# Patient Record
Sex: Female | Born: 1960 | Hispanic: No | Marital: Married | Smoking: Never smoker
Health system: Southern US, Community
[De-identification: ages and names within clinical notes are randomized; demographics above are authoritative.]

## PROBLEM LIST (undated history)

## (undated) DIAGNOSIS — E7800 Pure hypercholesterolemia, unspecified: Secondary | ICD-10-CM

## (undated) DIAGNOSIS — E119 Type 2 diabetes mellitus without complications: Secondary | ICD-10-CM

## (undated) HISTORY — DX: Pure hypercholesterolemia, unspecified: E78.00

## (undated) HISTORY — PX: AUGMENTATION MAMMAPLASTY: SUR837

## (undated) HISTORY — DX: Type 2 diabetes mellitus without complications: E11.9

---

## 2009-04-25 ENCOUNTER — Other Ambulatory Visit: Admission: RE | Admit: 2009-04-25 | Discharge: 2009-04-25 | Payer: Self-pay | Admitting: Family Medicine

## 2009-05-11 ENCOUNTER — Encounter: Admission: RE | Admit: 2009-05-11 | Discharge: 2009-05-11 | Payer: Self-pay | Admitting: Family Medicine

## 2009-09-20 ENCOUNTER — Ambulatory Visit (HOSPITAL_COMMUNITY): Admission: RE | Admit: 2009-09-20 | Discharge: 2009-09-20 | Payer: Self-pay | Admitting: Specialist

## 2009-10-31 ENCOUNTER — Encounter: Admission: RE | Admit: 2009-10-31 | Discharge: 2009-10-31 | Payer: Self-pay | Admitting: Family Medicine

## 2010-08-03 ENCOUNTER — Other Ambulatory Visit: Payer: Self-pay | Admitting: Family Medicine

## 2010-08-03 DIAGNOSIS — Z1231 Encounter for screening mammogram for malignant neoplasm of breast: Secondary | ICD-10-CM

## 2010-08-14 ENCOUNTER — Ambulatory Visit
Admission: RE | Admit: 2010-08-14 | Discharge: 2010-08-14 | Disposition: A | Discharge status: Home / Self Care | Payer: Managed Care, Other (non HMO) | Source: Ambulatory Visit | Attending: Family Medicine | Admitting: Family Medicine

## 2010-08-14 DIAGNOSIS — Z1231 Encounter for screening mammogram for malignant neoplasm of breast: Secondary | ICD-10-CM

## 2010-08-15 ENCOUNTER — Other Ambulatory Visit: Payer: Self-pay | Admitting: Family Medicine

## 2010-08-15 DIAGNOSIS — R928 Other abnormal and inconclusive findings on diagnostic imaging of breast: Secondary | ICD-10-CM

## 2010-08-27 ENCOUNTER — Ambulatory Visit
Admission: RE | Admit: 2010-08-27 | Discharge: 2010-08-27 | Disposition: A | Discharge status: Home / Self Care | Payer: Managed Care, Other (non HMO) | Source: Ambulatory Visit | Attending: Family Medicine | Admitting: Family Medicine

## 2010-08-27 DIAGNOSIS — R928 Other abnormal and inconclusive findings on diagnostic imaging of breast: Secondary | ICD-10-CM

## 2011-05-24 ENCOUNTER — Other Ambulatory Visit: Payer: Self-pay | Admitting: Family Medicine

## 2011-05-24 ENCOUNTER — Ambulatory Visit
Admission: RE | Admit: 2011-05-24 | Discharge: 2011-05-24 | Disposition: A | Discharge status: Home / Self Care | Payer: Managed Care, Other (non HMO) | Source: Ambulatory Visit | Attending: Family Medicine | Admitting: Family Medicine

## 2011-05-24 DIAGNOSIS — M5412 Radiculopathy, cervical region: Secondary | ICD-10-CM

## 2011-06-18 ENCOUNTER — Other Ambulatory Visit: Payer: Self-pay | Admitting: Family Medicine

## 2011-06-18 ENCOUNTER — Other Ambulatory Visit (HOSPITAL_COMMUNITY)
Admission: RE | Admit: 2011-06-18 | Discharge: 2011-06-18 | Disposition: A | Discharge status: Home / Self Care | Payer: Managed Care, Other (non HMO) | Source: Ambulatory Visit | Attending: Family Medicine | Admitting: Family Medicine

## 2011-06-18 DIAGNOSIS — Z124 Encounter for screening for malignant neoplasm of cervix: Secondary | ICD-10-CM | POA: Insufficient documentation

## 2011-06-18 DIAGNOSIS — Z1159 Encounter for screening for other viral diseases: Secondary | ICD-10-CM | POA: Insufficient documentation

## 2011-07-01 ENCOUNTER — Other Ambulatory Visit: Payer: Self-pay | Admitting: Family Medicine

## 2011-07-01 DIAGNOSIS — Z1231 Encounter for screening mammogram for malignant neoplasm of breast: Secondary | ICD-10-CM

## 2011-08-15 ENCOUNTER — Ambulatory Visit: Payer: Managed Care, Other (non HMO)

## 2011-09-11 ENCOUNTER — Other Ambulatory Visit: Payer: Self-pay | Admitting: Family Medicine

## 2011-09-11 DIAGNOSIS — R102 Pelvic and perineal pain: Secondary | ICD-10-CM

## 2011-09-19 ENCOUNTER — Ambulatory Visit
Admission: RE | Admit: 2011-09-19 | Discharge: 2011-09-19 | Disposition: A | Discharge status: Home / Self Care | Payer: Managed Care, Other (non HMO) | Source: Ambulatory Visit | Attending: Family Medicine | Admitting: Family Medicine

## 2011-09-19 DIAGNOSIS — R102 Pelvic and perineal pain: Secondary | ICD-10-CM

## 2012-06-23 ENCOUNTER — Other Ambulatory Visit: Payer: Self-pay | Admitting: Family Medicine

## 2012-06-23 ENCOUNTER — Other Ambulatory Visit (HOSPITAL_COMMUNITY)
Admission: RE | Admit: 2012-06-23 | Discharge: 2012-06-23 | Disposition: A | Discharge status: Home / Self Care | Payer: Managed Care, Other (non HMO) | Source: Ambulatory Visit | Attending: Family Medicine | Admitting: Family Medicine

## 2012-06-23 DIAGNOSIS — Z124 Encounter for screening for malignant neoplasm of cervix: Secondary | ICD-10-CM | POA: Insufficient documentation

## 2013-05-18 ENCOUNTER — Ambulatory Visit
Admission: RE | Admit: 2013-05-18 | Discharge: 2013-05-18 | Disposition: A | Discharge status: Home / Self Care | Payer: Managed Care, Other (non HMO) | Source: Ambulatory Visit | Attending: Family Medicine | Admitting: Family Medicine

## 2013-05-18 ENCOUNTER — Other Ambulatory Visit: Payer: Self-pay | Admitting: Family Medicine

## 2013-05-18 DIAGNOSIS — M25529 Pain in unspecified elbow: Secondary | ICD-10-CM

## 2013-06-07 ENCOUNTER — Other Ambulatory Visit: Payer: Self-pay | Admitting: Family Medicine

## 2013-06-07 ENCOUNTER — Other Ambulatory Visit: Payer: Self-pay

## 2013-06-07 ENCOUNTER — Other Ambulatory Visit (HOSPITAL_COMMUNITY)
Admission: RE | Admit: 2013-06-07 | Discharge: 2013-06-07 | Disposition: A | Discharge status: Home / Self Care | Payer: Managed Care, Other (non HMO) | Source: Ambulatory Visit | Attending: Family Medicine | Admitting: Family Medicine

## 2013-06-07 DIAGNOSIS — Z124 Encounter for screening for malignant neoplasm of cervix: Secondary | ICD-10-CM | POA: Insufficient documentation

## 2013-06-07 DIAGNOSIS — Z9882 Breast implant status: Secondary | ICD-10-CM

## 2013-06-07 DIAGNOSIS — Z1231 Encounter for screening mammogram for malignant neoplasm of breast: Secondary | ICD-10-CM

## 2013-06-14 ENCOUNTER — Ambulatory Visit: Payer: Managed Care, Other (non HMO)

## 2013-06-24 ENCOUNTER — Encounter (INDEPENDENT_AMBULATORY_CARE_PROVIDER_SITE_OTHER): Payer: Self-pay

## 2013-06-24 ENCOUNTER — Ambulatory Visit
Admission: RE | Admit: 2013-06-24 | Discharge: 2013-06-24 | Disposition: A | Discharge status: Home / Self Care | Payer: Private Health Insurance - Indemnity | Source: Ambulatory Visit

## 2013-06-24 DIAGNOSIS — Z1231 Encounter for screening mammogram for malignant neoplasm of breast: Secondary | ICD-10-CM

## 2013-06-24 DIAGNOSIS — Z9882 Breast implant status: Secondary | ICD-10-CM

## 2013-06-28 ENCOUNTER — Other Ambulatory Visit: Payer: Self-pay | Admitting: Family Medicine

## 2013-06-28 DIAGNOSIS — R928 Other abnormal and inconclusive findings on diagnostic imaging of breast: Secondary | ICD-10-CM

## 2013-07-02 ENCOUNTER — Ambulatory Visit
Admission: RE | Admit: 2013-07-02 | Discharge: 2013-07-02 | Disposition: A | Discharge status: Home / Self Care | Payer: Private Health Insurance - Indemnity | Source: Ambulatory Visit | Attending: Family Medicine | Admitting: Family Medicine

## 2013-07-02 ENCOUNTER — Other Ambulatory Visit: Payer: Self-pay | Admitting: Family Medicine

## 2013-07-02 DIAGNOSIS — R928 Other abnormal and inconclusive findings on diagnostic imaging of breast: Secondary | ICD-10-CM

## 2013-07-21 ENCOUNTER — Other Ambulatory Visit: Payer: Self-pay | Admitting: Family Medicine

## 2013-07-21 DIAGNOSIS — T8549XA Other mechanical complication of breast prosthesis and implant, initial encounter: Secondary | ICD-10-CM

## 2013-08-07 ENCOUNTER — Other Ambulatory Visit: Payer: Private Health Insurance - Indemnity

## 2013-08-17 ENCOUNTER — Ambulatory Visit
Admission: RE | Admit: 2013-08-17 | Discharge: 2013-08-17 | Disposition: A | Discharge status: Home / Self Care | Payer: Private Health Insurance - Indemnity | Source: Ambulatory Visit | Attending: Family Medicine | Admitting: Family Medicine

## 2013-08-17 DIAGNOSIS — T8549XA Other mechanical complication of breast prosthesis and implant, initial encounter: Secondary | ICD-10-CM

## 2014-07-11 ENCOUNTER — Other Ambulatory Visit: Payer: Self-pay | Admitting: Family Medicine

## 2014-07-11 ENCOUNTER — Other Ambulatory Visit (HOSPITAL_COMMUNITY)
Admission: RE | Admit: 2014-07-11 | Discharge: 2014-07-11 | Disposition: A | Discharge status: Home / Self Care | Payer: Managed Care, Other (non HMO) | Source: Ambulatory Visit | Attending: Family Medicine | Admitting: Family Medicine

## 2014-07-11 DIAGNOSIS — Z01411 Encounter for gynecological examination (general) (routine) with abnormal findings: Secondary | ICD-10-CM | POA: Diagnosis present

## 2014-07-13 LAB — CYTOLOGY - PAP

## 2015-07-14 ENCOUNTER — Other Ambulatory Visit: Payer: Self-pay

## 2015-07-14 DIAGNOSIS — Z1231 Encounter for screening mammogram for malignant neoplasm of breast: Secondary | ICD-10-CM

## 2015-08-07 ENCOUNTER — Other Ambulatory Visit: Payer: Self-pay

## 2015-08-07 ENCOUNTER — Other Ambulatory Visit: Payer: Self-pay | Admitting: Family Medicine

## 2015-08-07 DIAGNOSIS — N63 Unspecified lump in unspecified breast: Secondary | ICD-10-CM

## 2015-08-09 ENCOUNTER — Ambulatory Visit: Payer: Private Health Insurance - Indemnity

## 2015-08-16 ENCOUNTER — Ambulatory Visit
Admission: RE | Admit: 2015-08-16 | Discharge: 2015-08-16 | Disposition: A | Discharge status: Home / Self Care | Payer: Managed Care, Other (non HMO) | Source: Ambulatory Visit | Attending: Family Medicine | Admitting: Family Medicine

## 2015-08-16 ENCOUNTER — Other Ambulatory Visit: Payer: Self-pay | Admitting: Family Medicine

## 2015-08-16 DIAGNOSIS — N63 Unspecified lump in unspecified breast: Secondary | ICD-10-CM

## 2016-07-26 ENCOUNTER — Other Ambulatory Visit: Payer: Self-pay | Admitting: Family Medicine

## 2016-07-26 DIAGNOSIS — Z1231 Encounter for screening mammogram for malignant neoplasm of breast: Secondary | ICD-10-CM

## 2016-08-19 ENCOUNTER — Ambulatory Visit
Admission: RE | Admit: 2016-08-19 | Discharge: 2016-08-19 | Disposition: A | Discharge status: Home / Self Care | Payer: Managed Care, Other (non HMO) | Source: Ambulatory Visit | Attending: Family Medicine | Admitting: Family Medicine

## 2016-08-19 DIAGNOSIS — Z1231 Encounter for screening mammogram for malignant neoplasm of breast: Secondary | ICD-10-CM

## 2016-08-20 ENCOUNTER — Other Ambulatory Visit: Payer: Self-pay | Admitting: Family Medicine

## 2016-08-20 DIAGNOSIS — R928 Other abnormal and inconclusive findings on diagnostic imaging of breast: Secondary | ICD-10-CM

## 2016-08-23 ENCOUNTER — Ambulatory Visit
Admission: RE | Admit: 2016-08-23 | Discharge: 2016-08-23 | Disposition: A | Discharge status: Home / Self Care | Payer: Managed Care, Other (non HMO) | Source: Ambulatory Visit | Attending: Family Medicine | Admitting: Family Medicine

## 2016-08-23 DIAGNOSIS — R928 Other abnormal and inconclusive findings on diagnostic imaging of breast: Secondary | ICD-10-CM

## 2016-10-03 ENCOUNTER — Encounter: Payer: Self-pay | Admitting: Physical Therapy

## 2016-10-03 ENCOUNTER — Ambulatory Visit: Payer: Managed Care, Other (non HMO) | Attending: Family Medicine | Admitting: Physical Therapy

## 2016-10-03 DIAGNOSIS — M542 Cervicalgia: Secondary | ICD-10-CM

## 2016-10-03 DIAGNOSIS — M6281 Muscle weakness (generalized): Secondary | ICD-10-CM | POA: Diagnosis present

## 2016-10-03 DIAGNOSIS — G8929 Other chronic pain: Secondary | ICD-10-CM | POA: Insufficient documentation

## 2016-10-03 DIAGNOSIS — M545 Low back pain: Secondary | ICD-10-CM | POA: Insufficient documentation

## 2016-10-03 NOTE — Therapy (Signed)
[facility] [address] [place], [place], [address] Phone: [phone]   Fax:  [phone]  Physical Therapy Evaluation  Patient Details  Name: Elizabeth Norris MRN: [id number] Date of Birth: [birth year] Referring Provider: Dr. Farris Has  Encounter Date: 10/03/2016      PT End of Session - 10/03/16 1520    Visit Number 1   Date for PT Re-Evaluation 11/28/16   Authorization Type Aetna   Authorization - Visit Number 1   Authorization - Number of Visits 25   PT Start Time 1430   PT Stop Time 1515   PT Time Calculation (min) 45 min   Activity Tolerance Patient tolerated treatment well   Behavior During Therapy [facility] for tasks assessed/performed      Past Medical History:  Diagnosis Date  . Hypercholesterolemia     Past Surgical History:  Procedure Laterality Date  . AUGMENTATION MAMMAPLASTY Bilateral     There were no vitals filed for this visit.       Subjective Assessment - 10/03/16 1437    Subjective Patient reports chronic issues with her neck and back.  The last 2-3 weeks her neck has flared up and in shoulders. Patient was pressure washing overhead and had increased pain afterwards   Patient Stated Goals reduce pain   Currently in Pain? Yes   Pain Score 5    Pain Location Neck   Pain Orientation Right;Left   Pain Descriptors / Indicators Sore;Constant   Pain Type Chronic pain   Pain Onset 1 to 4 weeks ago   Pain Frequency Constant   Aggravating Factors  movement   Pain Relieving Factors pressure on neck   Multiple Pain Sites Yes   Pain Score 8   Pain Location Back   Pain Orientation Lower   Pain Descriptors / Indicators Tightness   Pain Type Chronic pain   Pain Onset 1 to 4 weeks ago   Aggravating Factors  movement; laying down   Pain Relieving Factors stretch, massage, heat, hot shower            [facility] PT Assessment - 10/03/16 0001      Assessment   Medical Diagnosis M54.2  Neck pain   Referring Provider Dr. Farris Has   Onset Date/Surgical Date 09/12/16   Prior Therapy None     Precautions   Precautions None     Restrictions   Weight Bearing Restrictions No     Balance Screen   Has the patient fallen in the past 6 months No   Has the patient had a decrease in activity level because of a fear of falling?  No   Is the patient reluctant to leave their home because of a fear of falling?  No     Home Tourist information centre manager residence     Prior Function   Level of Independence Independent     Cognition   Overall Cognitive Status Within Functional Limits for tasks assessed     Observation/Other Assessments   Focus on Therapeutic Outcomes (FOTO)  37% limitation  goal is 31% limitation     Posture/Postural Control   Posture/Postural Control Postural limitations   Postural Limitations Rounded Shoulders;Forward head     ROM / Strength   AROM / PROM / Strength AROM;PROM;Strength     AROM   Cervical Flexion full   Cervical Extension full wiht chin going to left   Cervical - Right Side Bend full  Cervical - Left Side Bend full   Cervical - Right Rotation decreased by 25%   Cervical - Left Rotation decreased by 25%   Lumbar Flexion full   Lumbar Extension decreased by 25%   Lumbar - Right Side Bend decreased by 25%   Lumbar - Left Side Bend full   Lumbar - Right Rotation decreased by 25%    Lumbar - Left Rotation full     Strength   Overall Strength Comments bil. shoulder strength is 4/5   Strength Assessment Site Hip   Right Hip Flexion 4/5   Right Hip ABduction 3+/5   Left Hip Flexion 4/5   Left Hip ABduction 3+/5     Palpation   SI assessment  decreased mobility of L1-L5   Palpation comment tenderness located in lumbar paraspinals, bil. cervical paraspinals            Objective measurements completed on examination: See above findings.                  PT Education - 10/03/16 1519     Education provided Yes   Education Details information on dry needling; stretches for back and nexk   Person(s) Educated Patient   Methods Explanation;Demonstration;Verbal cues;Handout   Comprehension Returned demonstration;Verbalized understanding          PT Short Term Goals - 10/03/16 1529      PT SHORT TERM GOAL #1   Title independent with initial HEP   Time 4   Period Weeks   Status New   Target Date 10/31/16     PT SHORT TERM GOAL #2   Title understand correct body mechanics with daily tasks and cleaning to reduce strain on neck and back   Time 4   Period Weeks   Status New   Target Date 10/31/16     PT SHORT TERM GOAL #3   Title pain decreased >/= 25% with daily tasks due to reduction in trigger points in cervical and back muscles   Time 4   Period Weeks   Status New   Target Date 10/31/16           PT Long Term Goals - 10/03/16 1507      PT LONG TERM GOAL #1   Title independent with HEP   Time 8   Period Weeks   Status New   Target Date 11/28/16     PT LONG TERM GOAL #2   Title perfrom daily activities with stiffness in neck and back decreased >/= 75% due to improved muscle flexibility   Time 8   Status New   Target Date 11/28/16     PT LONG TERM GOAL #3   Title sitting to watch a 1 hour TV program with cervical and neck and neck pain decreased >/= 75% due to increased postural strength   Time 8   Period Weeks   Status New   Target Date 11/28/16     PT LONG TERM GOAL #4   Title cleaning her home when bending down with pain decreased >/= 75% due to increased postural  and bilateral hip strength   Time 8   Period Weeks   Status New   Target Date 11/28/16     PT LONG TERM GOAL #5   Title FOTO score </= 31% limitation   Time 8   Period Weeks   Status New   Target Date 11/28/16  Plan - 10/03/16 1521    Clinical Impression Statement Patient is a 56 year old female with chronic neck and back which flared-up 3 weeks  ago when she was power washing overhead. Cervical pain is 5/10 and back pain is 8/10 and is constant. Patient reports activity and movement increase pain.  Bilateral shouder strength is 4/5 and bilateral hip abduction 3+/5 and fleixon is 4/5.  Patient has limitation with lumbar extension and right sidebending.  Patient has decreased bilateral cervical rotation.  Patient has decreased mobility of L1-L5.  Palpable tenderness located in bil. cervical, thorasic, and lumbar paraspinals.  Patient will benefit from skilled therapy to reduce muscle tension and improve strength to reduce pain with daily activities.    History and Personal Factors relevant to plan of care: none   Clinical Presentation Stable   Clinical Presentation due to: stable condition   Clinical Decision Making Low   Rehab Potential Excellent   Clinical Impairments Affecting Rehab Potential None   PT Frequency 2x / week   PT Duration 8 weeks   PT Treatment/Interventions Cryotherapy;Electrical Stimulation;Moist Heat;Ultrasound;Traction;Patient/family education;Neuromuscular re-education;Therapeutic exercise;Therapeutic activities;Manual techniques;Dry needling;Taping   PT Next Visit Plan dry needling to cervical multifidi and upper trap, lumbar multifidi; soft tissue work to same; Estate manager/land agentbody mechanics with daily tasks; bil. shoulder, hip abductors, postural muscle strength   PT Home Exercise Plan progress as needed   Consulted and Agree with Plan of Care Patient      Patient will benefit from skilled therapeutic intervention in order to improve the following deficits and impairments:  Decreased range of motion, Increased fascial restricitons, Pain, Decreased strength, Decreased mobility, Decreased activity tolerance, Decreased endurance  Visit Diagnosis: Muscle weakness (generalized) - Plan: PT plan of care cert/re-cert  Cervicalgia - Plan: PT plan of care cert/re-cert  Chronic midline low back pain without sciatica - Plan: PT plan of  care cert/re-cert     Problem List There are no active problems to display for this patient.   Eulis FosterCheryl Emilija Bohman, PT 10/03/16 3:34 PM    [facility] [address] [place], [place], [address] Phone: [phone]   Fax:  [phone]  Name: Derl BarrowYolanda Van Parys MRN: [id number] Date of Birth: 1960-07-19

## 2016-10-03 NOTE — Patient Instructions (Addendum)
Arm / Leg Lift: Opposite (Prone)    Lift right leg and opposite arm _2___ inches from floor, keeping knee locked. Repeat _10___ times per set. Do __1__ sets per session. Do __1__ sessions per day.  http://orth.exer.us/100   Copyright  VHI. All rights reserved.  Trigger Point Dry Needling  . What is Trigger Point Dry Needling (DN)? o DN is a physical therapy technique used to treat muscle pain and dysfunction. Specifically, DN helps deactivate muscle trigger points (muscle knots).  o A thin filiform needle is used to penetrate the skin and stimulate the underlying trigger point. The goal is for a local twitch response (LTR) to occur and for the trigger point to relax. No medication of any kind is injected during the procedure.   . What Does Trigger Point Dry Needling Feel Like?  o The procedure feels different for each individual patient. Some patients report that they do not actually feel the needle enter the skin and overall the process is not painful. Very mild bleeding may occur. However, many patients feel a deep cramping in the muscle in which the needle was inserted. This is the local twitch response.   Marland Kitchen How Will I feel after the treatment? o Soreness is normal, and the onset of soreness may not occur for a few hours. Typically this soreness does not last longer than two days.  o Bruising is uncommon, however; ice can be used to decrease any possible bruising.  o In rare cases feeling tired or nauseous after the treatment is normal. In addition, your symptoms may get worse before they get better, this period will typically not last longer than 24 hours.   . What Can I do After My Treatment? o Increase your hydration by drinking more water for the next 24 hours. o You may place ice or heat on the areas treated that have become sore, however, do not use heat on inflamed or bruised areas. Heat often brings more relief post needling. o You can continue your regular activities, but  vigorous activity is not recommended initially after the treatment for 24 hours. o DN is best combined with other physical therapy such as strengthening, stretching, and other therapies.    Knee-to-Chest Stretch: Bilateral    With hands behind knees, pull both knees in to chest until a comfortable stretch is felt in lower back and buttocks. Keep back relaxed. Hold __30__ seconds. Repeat _2___ times per set. Do _1___ sets per session. Do _1___ sessions per day.  http://orth.exer.us/128   Copyright  VHI. All rights reserved.  Lumbar Rotation Stretch    Lie on back with left knee drawn toward chest. Slowly bring bent leg across body until stretch is felt in lower back/hip area. Hold _30___ seconds. Repeat __2__ times per set. Do _1___ sets per session. Do _1___ sessions per day.  http://orth.exer.us/198   Copyright  VHI. All rights reserved.  Flexibility: Neck Stretch    Grasp left arm above wrist and pull down across body while gently tilting head same direction. Hold __30__ seconds. Relax. Repeat __1__ times per set. Do _1___ sets per session. Do ___1_ sessions per day.  http://orth.exer.us/346   Copyright  VHI. All rights reserved.  Chest / Bicep Stretch    Lace fingers behind back and squeeze shoulder blades together. Slowly raise and straighten arms. Hold __30__ seconds. Repeat __2__ times per set. Do __1__ sets per session. Do __1__ sessions per day.  http://orth.exer.us/352   Copyright  VHI. All rights reserved.  Levator  Scapula Stretch, Sitting    Sit, one hand tucked under hip on side to be stretched, other hand over top of head. Turn head toward other side and look down. Use hand on head to gently stretch neck in that position. Hold _30__ seconds. Repeat _2__ times per session. Do _1__ sessions per day.  Copyright  VHI. All rights reserved.  Angry Cat, All Fours    Kneel on hands and knees. Tuck chin and tighten stomach. Exhale and round back  upward. Inhale and arch back downward. Hold each position _1__ seconds. Repeat _10__ times per session. Do _1__ sessions per day.  Copyright  VHI. All rights reserved.  [facility] [address], [address] [place], [place] [address] Phone # [phone] Fax [phone]

## 2016-10-14 ENCOUNTER — Ambulatory Visit: Payer: Managed Care, Other (non HMO) | Admitting: Physical Therapy

## 2016-10-14 ENCOUNTER — Encounter: Payer: Self-pay | Admitting: Physical Therapy

## 2016-10-14 DIAGNOSIS — M545 Low back pain: Secondary | ICD-10-CM

## 2016-10-14 DIAGNOSIS — M6281 Muscle weakness (generalized): Secondary | ICD-10-CM

## 2016-10-14 DIAGNOSIS — G8929 Other chronic pain: Secondary | ICD-10-CM

## 2016-10-14 DIAGNOSIS — M542 Cervicalgia: Secondary | ICD-10-CM

## 2016-10-14 NOTE — Patient Instructions (Signed)
   Upper Cervical Rotation Self Mobilization   With your arms crossed hold the towel firmly to your chest and the other hand has the towel pressed against your check bone. Pull the towel across your cheekbone with the towel doing the work and your neck feeling the stretch  Hold 20 sec repeat 3x each side

## 2016-10-14 NOTE — Therapy (Signed)
[facility] [address], [address] [place], [place], [address] Phone: [phone]   Fax:  [phone]  Physical Therapy Treatment  Patient Details  Name: Elizabeth Norris MRN: [id number] Date of Birth: Aug 14, 1960 Referring Provider: Dr. Farris Has  Encounter Date: 10/14/2016      PT End of Session - 10/14/16 1542    Visit Number 2   Date for PT Re-Evaluation 11/28/16   Authorization Type Aetna   Authorization - Visit Number 2   Authorization - Number of Visits 25   PT Start Time 1538   PT Stop Time 1618   PT Time Calculation (min) 40 min   Activity Tolerance Patient tolerated treatment well   Behavior During Therapy [facility] for tasks assessed/performed      Past Medical History:  Diagnosis Date  . Hypercholesterolemia     Past Surgical History:  Procedure Laterality Date  . AUGMENTATION MAMMAPLASTY Bilateral     There were no vitals filed for this visit.                       [facility] Adult PT Treatment/Exercise - 10/14/16 0001      Exercises   Exercises Neck     Neck Exercises: Seated   Other Seated Exercise cervical rotation with towel, upper trap stretch - 3x 30 sec     Manual Therapy   Manual Therapy Soft tissue mobilization   Soft tissue mobilization suboccipitals, cervical paraspinals, UT, thoracic paraspinals          Trigger Point Dry Needling - 10/14/16 1617    Consent Given? Yes   Education Handout Provided Yes   Muscles Treated Upper Body Upper trapezius;Suboccipitals muscle group;Oblique capitus   Upper Trapezius Response Twitch reponse elicited;Palpable increased muscle length   Oblique Capitus Response Twitch response elicited;Palpable increased muscle length  cervical and T1-3 multifidi   SubOccipitals Response Twitch response elicited;Palpable increased muscle length              PT Education - 10/14/16 1628    Education provided Yes   Education Details  cervical rotation    Person(s) Educated Patient   Methods Explanation;Demonstration;Verbal cues;Handout   Comprehension Verbalized understanding          PT Short Term Goals - 10/03/16 1529      PT SHORT TERM GOAL #1   Title independent with initial HEP   Time 4   Period Weeks   Status New   Target Date 10/31/16     PT SHORT TERM GOAL #2   Title understand correct body mechanics with daily tasks and cleaning to reduce strain on neck and back   Time 4   Period Weeks   Status New   Target Date 10/31/16     PT SHORT TERM GOAL #3   Title pain decreased >/= 25% with daily tasks due to reduction in trigger points in cervical and back muscles   Time 4   Period Weeks   Status New   Target Date 10/31/16           PT Long Term Goals - 10/03/16 1507      PT LONG TERM GOAL #1   Title independent with HEP   Time 8   Period Weeks   Status New   Target Date 11/28/16     PT LONG TERM GOAL #2   Title perfrom daily activities with stiffness in neck and back decreased >/= 75% due to  improved muscle flexibility   Time 8   Status New   Target Date 11/28/16     PT LONG TERM GOAL #3   Title sitting to watch a 1 hour TV program with cervical and neck and neck pain decreased >/= 75% due to increased postural strength   Time 8   Period Weeks   Status New   Target Date 11/28/16     PT LONG TERM GOAL #4   Title cleaning her home when bending down with pain decreased >/= 75% due to increased postural  and bilateral hip strength   Time 8   Period Weeks   Status New   Target Date 11/28/16     PT LONG TERM GOAL #5   Title FOTO score </= 31% limitation   Time 8   Period Weeks   Status New   Target Date 11/28/16               Plan - 10/14/16 1645    Clinical Impression Statement Patient had improved muscle length after needling and mnaual technques.  Pt did not meet any goals as of today due to only being initial treatment since eval.  She will benefit from skilled PT  to work on improved flexibility and ROM as well as improved posture in order to improve ability to perform functional activities.   PT Treatment/Interventions Cryotherapy;Electrical Stimulation;Moist Heat;Ultrasound;Traction;Patient/family education;Neuromuscular re-education;Therapeutic exercise;Therapeutic activities;Manual techniques;Dry needling;Taping   PT Next Visit Plan f/u on dry needling to cervical multifidi and upper trap, soft tissue/dry needling add lumbar multifidi; body mechanics with daily tasks; bil. shoulder, hip abductors, postural muscle strength   PT Home Exercise Plan progress as needed   Consulted and Agree with Plan of Care Patient      Patient will benefit from skilled therapeutic intervention in order to improve the following deficits and impairments:  Decreased range of motion, Increased fascial restricitons, Pain, Decreased strength, Decreased mobility, Decreased activity tolerance, Decreased endurance  Visit Diagnosis: Muscle weakness (generalized)  Cervicalgia  Chronic midline low back pain without sciatica     Problem List There are no active problems to display for this patient.   [name], PT 10/14/2016, 4:53 PM  [facility] [address], [address] [place], [place], [address] Phone: [phone]   Fax:  [phone]  Name: Elizabeth Norris MRN: [id number] Date of Birth: Mar 17, 1960

## 2016-10-16 ENCOUNTER — Ambulatory Visit: Payer: Managed Care, Other (non HMO) | Admitting: Physical Therapy

## 2016-10-16 ENCOUNTER — Encounter: Payer: Self-pay | Admitting: Physical Therapy

## 2016-10-16 DIAGNOSIS — M542 Cervicalgia: Secondary | ICD-10-CM

## 2016-10-16 DIAGNOSIS — M6281 Muscle weakness (generalized): Secondary | ICD-10-CM | POA: Diagnosis not present

## 2016-10-16 DIAGNOSIS — M545 Low back pain, unspecified: Secondary | ICD-10-CM

## 2016-10-16 DIAGNOSIS — G8929 Other chronic pain: Secondary | ICD-10-CM

## 2016-10-16 NOTE — Therapy (Signed)
[facility] [address], [address] [place], [place], [address] Phone: [phone]   Fax:  [phone]  Physical Therapy Treatment  Patient Details  Name: Elizabeth BarrowYolanda Van Norris MRN: [id number] Date of Birth: 1960-12-27 Referring Provider: Dr. Farris HasAaron Morrow  Encounter Date: 10/16/2016      PT End of Session - 10/16/16 0810    Visit Number 3   Date for PT Re-Evaluation 11/28/16   Authorization Type Aetna   Authorization - Visit Number 3   Authorization - Number of Visits 25   PT Start Time 0807  Pt late   PT Stop Time 0845   PT Time Calculation (min) 38 min   Activity Tolerance Patient tolerated treatment well   Behavior [facility] Therapy [facility] for tasks assessed/performed      Past Medical History:  Diagnosis Date  . Hypercholesterolemia     Past Surgical History:  Procedure Laterality Date  . AUGMENTATION MAMMAPLASTY Bilateral     There were no vitals filed for this visit.      Subjective Assessment - 10/16/16 0812    Subjective I felt great after the dry needling. No pain this morning, just my stiffness.   Patient Stated Goals reduce pain   Currently in Pain? No/denies                         [facility] Adult PT Treatment/Exercise - 10/16/16 0001      Self-Care   Self-Care Posture   Posture Lumbar protective body mechanics for ADLs/chores      Neck Exercises: Machines for Strengthening   UBE (Upper Arm Bike) L1 3x3 with lumbar support  review of status concurent     Neck Exercises: Supine   Other Supine Exercise neck & back decompresison and MELT hydration techniques      Knee/Hip Exercises: Stretches   LobbyistQuad Stretch Left;3 reps;20 seconds   Hip Flexor Stretch Left;2 reps;20 seconds                PT Education - 10/16/16 0830    Education provided Yes   Education Details HEP: LT quad and hip flexor stretch   Person(s) Educated Patient   Methods Explanation;Demonstration;Tactile  cues;Verbal cues   Comprehension Returned demonstration;Verbalized understanding          PT Short Term Goals - 10/03/16 1529      PT SHORT TERM GOAL #1   Title independent with initial HEP   Time 4   Period Weeks   Status New   Target Date 10/31/16     PT SHORT TERM GOAL #2   Title understand correct body mechanics with daily tasks and cleaning to reduce strain on neck and back   Time 4   Period Weeks   Status New   Target Date 10/31/16     PT SHORT TERM GOAL #3   Title pain decreased >/= 25% with daily tasks due to reduction in trigger points in cervical and back muscles   Time 4   Period Weeks   Status New   Target Date 10/31/16           PT Long Term Goals - 10/03/16 1507      PT LONG TERM GOAL #1   Title independent with HEP   Time 8   Period Weeks   Status New   Target Date 11/28/16     PT LONG TERM GOAL #2   Title perfrom daily activities with stiffness  in neck and back decreased >/= 75% due to improved muscle flexibility   Time 8   Status New   Target Date 11/28/16     PT LONG TERM GOAL #3   Title sitting to watch a 1 hour TV program with cervical and neck and neck pain decreased >/= 75% due to increased postural strength   Time 8   Period Weeks   Status New   Target Date 11/28/16     PT LONG TERM GOAL #4   Title cleaning her home when bending down with pain decreased >/= 75% due to increased postural  and bilateral hip strength   Time 8   Period Weeks   Status New   Target Date 11/28/16     PT LONG TERM GOAL #5   Title FOTO score </= 31% limitation   Time 8   Period Weeks   Status New   Target Date 11/28/16               Plan - 10/16/16 3244    Clinical Impression Statement Pt is doing well with her dry needling treatments. Today we complimented the dry needling with foam roll decompression and hydration techniques for the upper and lower extremities.    Rehab Potential Excellent   Clinical Impairments Affecting Rehab  Potential None   PT Frequency 2x / week   PT Duration 8 weeks   PT Treatment/Interventions Cryotherapy;Electrical Stimulation;Moist Heat;Ultrasound;Traction;Patient/family education;Neuromuscular re-education;Therapeutic exercise;Therapeutic activities;Manual techniques;Dry needling;Taping   PT Next Visit Plan f/u on dry needling to cervical multifidi and upper trap, soft tissue/dry needling add lumbar multifidi; body mechanics with daily tasks; bil. shoulder, hip abductors, postural muscle strength   Consulted and Agree with Plan of Care Patient      Patient will benefit from skilled therapeutic intervention in order to improve the following deficits and impairments:  Decreased range of motion, Increased fascial restricitons, Pain, Decreased strength, Decreased mobility, Decreased activity tolerance, Decreased endurance  Visit Diagnosis: Muscle weakness (generalized)  Cervicalgia  Chronic midline low back pain without sciatica     Problem List There are no active problems to display for this patient.   Katelee Schupp, PTA 10/16/2016, 8:45 AM  [place] [address], [address] [place], [place], [address] Phone: [phone]   Fax:  [phone]  Name: Elizabeth Norris MRN: [id number] Date of Birth: 12/15/1960

## 2016-10-28 ENCOUNTER — Ambulatory Visit: Payer: Managed Care, Other (non HMO) | Attending: Family Medicine | Admitting: Physical Therapy

## 2016-10-28 ENCOUNTER — Encounter: Payer: Self-pay | Admitting: Physical Therapy

## 2016-10-28 DIAGNOSIS — G8929 Other chronic pain: Secondary | ICD-10-CM | POA: Diagnosis present

## 2016-10-28 DIAGNOSIS — M545 Low back pain: Secondary | ICD-10-CM | POA: Diagnosis present

## 2016-10-28 DIAGNOSIS — M6281 Muscle weakness (generalized): Secondary | ICD-10-CM | POA: Diagnosis present

## 2016-10-28 DIAGNOSIS — M542 Cervicalgia: Secondary | ICD-10-CM | POA: Diagnosis present

## 2016-10-28 NOTE — Therapy (Signed)
[facility] [address], [address] [place], [place], [address] Phone: [phone]   Fax:  [phone]  Physical Therapy Treatment  Patient Details  Name: Elizabeth BarrowYolanda Van Parys MRN: [id number] Date of Birth: 1960/04/08 Referring Provider: Dr. Farris HasAaron Morrow  Encounter Date: 10/28/2016      PT End of Session - 10/28/16 1514    Visit Number 4   Authorization Type Aetna   Authorization - Visit Number 4   Authorization - Number of Visits 25   PT Start Time 1514   PT Stop Time 1601   PT Time Calculation (min) 47 min   Activity Tolerance Patient tolerated treatment well   Behavior During Therapy [facility] for tasks assessed/performed      Past Medical History:  Diagnosis Date  . Hypercholesterolemia     Past Surgical History:  Procedure Laterality Date  . AUGMENTATION MAMMAPLASTY Bilateral     There were no vitals filed for this visit.      Subjective Assessment - 10/28/16 1518    Subjective Feeling much better.  Just a little stiffness today mostly with reaching up.     Patient Stated Goals reduce pain   Currently in Pain? No/denies                         [facility] - 10/28/16 0001      Neck Exercises: Machines for Strengthening   UBE (Upper Arm Bike) L1 3x3 with lumbar support  review of status concurent   Other Machines for Strengthening pec stretch in doorway     Manual Therapy   Manual Therapy Soft tissue mobilization   Soft tissue mobilization suboccipitals, cervical paraspinals, UT, thoracic paraspinals          Trigger Point Dry Needling - 10/28/16 1523    Consent Given? Yes   Muscles Treated Upper Body Oblique capitus;Suboccipitals muscle group;Upper trapezius   Upper Trapezius Response Twitch reponse elicited;Palpable increased muscle length   Oblique Capitus Response Twitch response elicited;Palpable increased muscle length  T1-3 multifidi   SubOccipitals Response  Twitch response elicited;Palpable increased muscle length                PT Short Term Goals - 10/28/16 1519      PT SHORT TERM GOAL #1   Title independent with initial HEP   Time 4   Period Weeks   Status Achieved     PT SHORT TERM GOAL #2   Title understand correct body mechanics with daily tasks and cleaning to reduce strain on neck and back   Time 4   Period Weeks   Status Achieved     PT SHORT TERM GOAL #3   Title pain decreased >/= 25% with daily tasks due to reduction in trigger points in cervical and back muscles   Time 4   Period Weeks   Status Achieved           PT Long Term Goals - 10/28/16 1520      PT LONG TERM GOAL #1   Title independent with HEP   Time 8   Period Weeks   Status On-going     PT LONG TERM GOAL #2   Title perfrom daily activities with stiffness in neck and back decreased >/= 75% due to improved muscle flexibility   Time 8   Period Weeks   Status On-going     PT LONG TERM GOAL #3   Title sitting  to watch a 1 hour TV program with cervical and neck and neck pain decreased >/= 75% due to increased postural strength   Time 8   Period Weeks   Status Achieved     PT LONG TERM GOAL #4   Title cleaning her home when bending down with pain decreased >/= 75% due to increased postural  and bilateral hip strength   Time 8   Period Weeks   Status On-going     PT LONG TERM GOAL #5   Title FOTO score </= 31% limitation   Time 8   Period Weeks   Status On-going               Plan - 10/28/16 1514    Clinical Impression Statement Patient doing well and responded well to manual and dry needling.  Pt had increased muscle length and ROM.  Was shown pec stretch and reviewed upper trap stretch.  Pt still needs skilled PT to ensure improved muscle function and able to stabilize correct posture with all functional activities.   PT Treatment/Interventions Cryotherapy;Electrical Stimulation;Moist Heat;Ultrasound;Traction;Patient/family  education;Neuromuscular re-education;Therapeutic exercise;Therapeutic activities;Manual techniques;Dry needling;Taping   PT Next Visit Plan f/u on DN #2, complimentary treatment for increased strength for scap stability, posture, core and hip strength, assess lumbar soft tissue and treat as needed   Consulted and Agree with Plan of Care Patient      Patient will benefit from skilled therapeutic intervention in order to improve the following deficits and impairments:  Decreased range of motion, Increased fascial restricitons, Pain, Decreased strength, Decreased mobility, Decreased activity tolerance, Decreased endurance  Visit Diagnosis: Muscle weakness (generalized)  Cervicalgia  Chronic midline low back pain without sciatica     Problem List There are no active problems to display for this patient.   [name], PT 10/28/2016, 4:09 PM  [facility] [address] [place], [place], [address] Phone: [phone]   Fax:  [phone]  Name: Odie Rauen MRN: [id number] Date of Birth: January 26, 1961

## 2016-10-30 ENCOUNTER — Ambulatory Visit: Payer: Managed Care, Other (non HMO) | Admitting: Physical Therapy

## 2016-10-30 ENCOUNTER — Encounter: Payer: Self-pay | Admitting: Physical Therapy

## 2016-10-30 DIAGNOSIS — M6281 Muscle weakness (generalized): Secondary | ICD-10-CM

## 2016-10-30 DIAGNOSIS — G8929 Other chronic pain: Secondary | ICD-10-CM

## 2016-10-30 DIAGNOSIS — M545 Low back pain: Secondary | ICD-10-CM

## 2016-10-30 DIAGNOSIS — M542 Cervicalgia: Secondary | ICD-10-CM
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
## 2016-11-04 ENCOUNTER — Encounter: Payer: Managed Care, Other (non HMO) | Admitting: Physical Therapy

## 2016-11-06 ENCOUNTER — Encounter: Payer: Self-pay | Admitting: Physical Therapy

## 2016-11-06 ENCOUNTER — Ambulatory Visit: Payer: Managed Care, Other (non HMO) | Admitting: Physical Therapy

## 2016-11-06 DIAGNOSIS — M6281 Muscle weakness (generalized): Secondary | ICD-10-CM

## 2016-11-06 DIAGNOSIS — M542 Cervicalgia: Secondary | ICD-10-CM

## 2016-11-06 DIAGNOSIS — G8929 Other chronic pain: Secondary | ICD-10-CM

## 2016-11-06 DIAGNOSIS — M545 Low back pain: Secondary | ICD-10-CM

## 2016-11-06 NOTE — Therapy (Signed)
[facility] [address], [address] [place], [place], [address] Phone: [phone]   Fax:  [phone]  Physical Therapy Treatment  Patient Details  Name: Elizabeth Norris MRN: [id number] Date of Birth: January 09, 1961 Referring Provider: Dr. Farris Has  Encounter Date: 11/06/2016      PT End of Session - 11/06/16 1023    Visit Number 6   Date for PT Re-Evaluation 11/28/16   Authorization Type Aetna   Authorization - Visit Number 6   Authorization - Number of Visits 25   PT Start Time 1015   PT Stop Time 1115   PT Time Calculation (min) 60 min   Activity Tolerance Patient tolerated treatment well   Behavior During Therapy [facility] for tasks assessed/performed      Past Medical History:  Diagnosis Date  . Hypercholesterolemia     Past Surgical History:  Procedure Laterality Date  . AUGMENTATION MAMMAPLASTY Bilateral     There were no vitals filed for this visit.      Subjective Assessment - 11/06/16 1026    Subjective I would like to try and have dry needling today. That helps relax my muscles greatly.   Currently in Pain? --  No pain, I feel tight    Multiple Pain Sites No                         [facility] Adult PT Treatment/Exercise - 11/06/16 0001      Moist Heat Therapy   Number Minutes Moist Heat 15 Minutes   Moist Heat Location Cervical;Lumbar Spine     Electrical Stimulation   Electrical Stimulation Location neck and back   Electrical Stimulation Action premod   Electrical Stimulation Parameters to tolerance   Electrical Stimulation Goals Pain     Manual Therapy   Manual Therapy Soft tissue mobilization   Soft tissue mobilization cervical and lumbar multifidi, upper trap          Trigger Point Dry Needling - 11/06/16 1119    Consent Given? Yes   Muscles Treated Upper Body Upper trapezius  cervical paraspinal C5-6   Muscles Treated Lower Body --  lumbar multifidi -twitch and  length palpated   Upper Trapezius Response Twitch reponse elicited;Palpable increased muscle length              PT Education - 11/06/16 1048    Education provided Yes   Education Details MELT Method soft foam roll repertoire for home.   Person(s) Educated Patient   Methods Explanation;Demonstration;Tactile cues;Verbal cues;Handout   Comprehension Verbalized understanding;Returned demonstration          PT Short Term Goals - 10/28/16 1519      PT SHORT TERM GOAL #1   Title independent with initial HEP   Time 4   Period Weeks   Status Achieved     PT SHORT TERM GOAL #2   Title understand correct body mechanics with daily tasks and cleaning to reduce strain on neck and back   Time 4   Period Weeks   Status Achieved     PT SHORT TERM GOAL #3   Title pain decreased >/= 25% with daily tasks due to reduction in trigger points in cervical and back muscles   Time 4   Period Weeks   Status Achieved           PT Long Term Goals - 10/28/16 1520      PT LONG TERM GOAL #1  Title independent with HEP   Time 8   Period Weeks   Status On-going     PT LONG TERM GOAL #2   Title perfrom daily activities with stiffness in neck and back decreased >/= 75% due to improved muscle flexibility   Time 8   Period Weeks   Status On-going     PT LONG TERM GOAL #3   Title sitting to watch a 1 hour TV program with cervical and neck and neck pain decreased >/= 75% due to increased postural strength   Time 8   Period Weeks   Status Achieved     PT LONG TERM GOAL #4   Title cleaning her home when bending down with pain decreased >/= 75% due to increased postural  and bilateral hip strength   Time 8   Period Weeks   Status On-going     PT LONG TERM GOAL #5   Title FOTO score </= 31% limitation   Time 8   Period Weeks   Status On-going               Plan - 11/06/16 1023    Rehab Potential Excellent   Clinical Impairments Affecting Rehab Potential None   PT  Frequency 2x / week   PT Duration 8 weeks   PT Treatment/Interventions Cryotherapy;Electrical Stimulation;Moist Heat;Ultrasound;Traction;Patient/family education;Neuromuscular re-education;Therapeutic exercise;Therapeutic activities;Manual techniques;Dry needling;Taping   Consulted and Agree with Plan of Care Patient      Patient will benefit from skilled therapeutic intervention in order to improve the following deficits and impairments:  Decreased range of motion, Increased fascial restricitons, Pain, Decreased strength, Decreased mobility, Decreased activity tolerance, Decreased endurance  Visit Diagnosis: Muscle weakness (generalized)  Cervicalgia  Chronic midline low back pain without sciatica     Problem List There are no active problems to display for this patient.   Zaven Klemens, PTA 11/06/2016, 3:30 PM  [facility] [address], [address] [place], [place], [address] Phone: [phone]   Fax:  [phone]  Name: Elizabeth Norris MRN: [id number] Date of Birth: 05-24-1960

## 2016-11-06 NOTE — Patient Instructions (Signed)
Foam roll instructions:  Lay vertically on the roll: head to tailbone  -Breathe! Relax....bring heaviness to your bones. Inhale through your nose, exhale out your mouth. Do this for 1-4 min. - Add single arm stretches, slowly, overhead 6-10x -Lift arms to 90 degrees, over your chest. Arms are straight, check your elbows!! Finger tips reach to the sky, then shoulder blades drop down. 10x slowly, this movement comes from your shoulder blades.  -Angel/swimming arms; Both arms together raise up, reaching over your head, then circle around. Inhale and exhale, do 5-10x - Rock side to side 1 min.   Then lay flat on your back to feel the changes happening to your body.     Move roll to a horizontal position:  -Lay your back at the shoulder blades on the roll,  Hold your neck!! No neck tension. Arch backwards over the roll  Slowly 10x. - You can explore the top, middle, and lower portions of your shoulder blades and arch from 3 different places.  - Glide: Lift your bottom up engaging the lowe abs. Pushing with your feet, glide up and down the roll in a VERY SMALL range of motion 10x slowly. Breathe!!

## 2016-11-11 ENCOUNTER — Encounter: Payer: Self-pay | Admitting: Physical Therapy

## 2016-11-11 ENCOUNTER — Ambulatory Visit: Payer: Managed Care, Other (non HMO) | Admitting: Physical Therapy

## 2016-11-11 DIAGNOSIS — M6281 Muscle weakness (generalized): Secondary | ICD-10-CM | POA: Diagnosis not present

## 2016-11-11 DIAGNOSIS — M545 Low back pain: Secondary | ICD-10-CM

## 2016-11-11 DIAGNOSIS — G8929 Other chronic pain: Secondary | ICD-10-CM

## 2016-11-11 DIAGNOSIS — M542 Cervicalgia: Secondary | ICD-10-CM

## 2016-11-11 NOTE — Therapy (Signed)
[facility] [address], [address] [place], [place], [address] Phone: [phone]   Fax:  [phone]  Physical Therapy Treatment  Patient Details  Name: Elizabeth Norris MRN: [id number] Date of Birth: 17-Oct-1960 Referring Provider: Dr. Farris Has  Encounter Date: 11/11/2016      PT End of Session - 11/11/16 1016    Visit Number 7   Date for PT Re-Evaluation 11/28/16   Authorization Type Aetna   Authorization - Visit Number 7   Authorization - Number of Visits 25   PT Start Time 1016   PT Stop Time 1102   PT Time Calculation (min) 46 min   Activity Tolerance Patient tolerated treatment well   Behavior During Therapy [facility] for tasks assessed/performed      Past Medical History:  Diagnosis Date  . Hypercholesterolemia     Past Surgical History:  Procedure Laterality Date  . AUGMENTATION MAMMAPLASTY Bilateral     There were no vitals filed for this visit.      Subjective Assessment - 11/11/16 1022    Subjective I feel better from last session.  I still have a little pain, but it is a lot better   Patient Stated Goals reduce pain   Currently in Pain? Yes   Pain Score 6    Pain Location Back   Pain Orientation Right;Left;Lower   Pain Descriptors / Indicators Tightness   Pain Type Chronic pain   Pain Onset 1 to 4 weeks ago   Pain Frequency Intermittent   Aggravating Factors  sweeping and vacuuming   Multiple Pain Sites Yes   Pain Score 4   Pain Location Neck   Pain Orientation Right;Left   Pain Descriptors / Indicators Tightness   Pain Radiating Towards shoulder   Pain Onset 1 to 4 weeks ago   Pain Frequency Intermittent   Aggravating Factors  movement, stretching, sleeping   Pain Relieving Factors heat, massage, dry needling                         [facility] Adult PT Treatment/Exercise - 11/11/16 0001      Neck Exercises: Supine   Other Supine Exercise neck AROM on foam roll - rotation  and ext for fascial release     Knee/Hip Exercises: Stretches   Other Knee/Hip Stretches supine: hip IR, ER, hip circles, windshield wiper in hooklying     Manual Therapy   Manual Therapy Soft tissue mobilization   Soft tissue mobilization cervical and lumbar multifidi, upper trap, suboccipitals          Trigger Point Dry Needling - 11/11/16 1040    Consent Given? Yes   Muscles Treated Upper Body Upper trapezius;Suboccipitals muscle group;Oblique capitus  multifidi C5-6   Muscles Treated Lower Body --  lumbar multifidi   Upper Trapezius Response Twitch reponse elicited;Palpable increased muscle length   Oblique Capitus Response Twitch response elicited;Palpable increased muscle length   SubOccipitals Response Twitch response elicited;Palpable increased muscle length                PT Short Term Goals - 10/28/16 1519      PT SHORT TERM GOAL #1   Title independent with initial HEP   Time 4   Period Weeks   Status Achieved     PT SHORT TERM GOAL #2   Title understand correct body mechanics with daily tasks and cleaning to reduce strain on neck and back   Time 4  Period Weeks   Status Achieved     PT SHORT TERM GOAL #3   Title pain decreased >/= 25% with daily tasks due to reduction in trigger points in cervical and back muscles   Time 4   Period Weeks   Status Achieved           PT Long Term Goals - 11/11/16 1019      PT LONG TERM GOAL #1   Title independent with HEP   Time 8   Period Weeks   Status On-going     PT LONG TERM GOAL #2   Title perfrom daily activities with stiffness in neck and back decreased >/= 75% due to improved muscle flexibility   Baseline 60-70% improved   Time 8   Period Weeks   Status On-going     PT LONG TERM GOAL #3   Title sitting to watch a 1 hour TV program with cervical and neck and neck pain decreased >/= 75% due to increased postural strength   Time 8   Period Weeks   Status Achieved     PT LONG TERM GOAL #4    Title cleaning her home when bending down with pain decreased >/= 75% due to increased postural  and bilateral hip strength   Baseline 25% less pain   Period Weeks   Status On-going     PT LONG TERM GOAL #5   Title FOTO score </= 31% limitation   Time 8   Period Weeks   Status On-going               Plan - 11/11/16 1021    Clinical Impression Statement Patient is feel overall much more flexible and pain has reduced noticeably since starting PT.  She continues to benefit from dry needling to increase muscle length and AROM feels improved after treatment.     PT Treatment/Interventions Cryotherapy;Electrical Stimulation;Moist Heat;Ultrasound;Traction;Patient/family education;Neuromuscular re-education;Therapeutic exercise;Therapeutic activities;Manual techniques;Dry needling;Taping   PT Next Visit Plan work on posture during functional tasks such as sweeping, DN if seen by PT and/or complimentary treatment for increased strength for scap stability, posture, core and hip strength, assess lumbar soft tissue and treat as needed   PT Home Exercise Plan progress as needed   Consulted and Agree with Plan of Care Patient      Patient will benefit from skilled therapeutic intervention in order to improve the following deficits and impairments:  Decreased range of motion, Increased fascial restricitons, Pain, Decreased strength, Decreased mobility, Decreased activity tolerance, Decreased endurance  Visit Diagnosis: Muscle weakness (generalized)  Cervicalgia  Chronic midline low back pain without sciatica     Problem List There are no active problems to display for this patient.   [name], PT 11/11/2016, 11:02 AM  [facility] [address], [address] [place], [place], [address] Phone: [phone]   Fax:  [phone]  Name: Elizabeth Norris MRN: [id number] Date of Birth: Jan 29, 1961

## 2016-11-13 ENCOUNTER — Encounter: Payer: Self-pay | Admitting: Physical Therapy

## 2016-11-13 ENCOUNTER — Ambulatory Visit: Payer: Managed Care, Other (non HMO) | Admitting: Physical Therapy

## 2016-11-13 DIAGNOSIS — M6281 Muscle weakness (generalized): Secondary | ICD-10-CM | POA: Diagnosis not present

## 2016-11-13 DIAGNOSIS — M545 Low back pain, unspecified: Secondary | ICD-10-CM

## 2016-11-13 DIAGNOSIS — M542 Cervicalgia: Secondary | ICD-10-CM

## 2016-11-13 DIAGNOSIS — G8929 Other chronic pain: Secondary | ICD-10-CM

## 2016-11-13 NOTE — Therapy (Signed)
[facility] [address], [address] [place], [place], [address] Phone: [phone]   Fax:  [phone]  Physical Therapy Treatment  Patient Details  Name: Elizabeth Norris MRN: [id number] Date of Birth: Nov 26, 1960 Referring Provider: Dr. Farris Has  Encounter Date: 11/13/2016      PT End of Session - 11/13/16 0816    Visit Number 8   Date for PT Re-Evaluation 11/28/16   Authorization Type [employer]   Authorization - Visit Number 8   Authorization - Number of Visits 25   PT Start Time 0812   PT Stop Time 0910   PT Time Calculation (min) 58 min   Activity Tolerance Patient tolerated treatment well   Behavior During Therapy [facility] for tasks assessed/performed      Past Medical History:  Diagnosis Date  . Hypercholesterolemia     Past Surgical History:  Procedure Laterality Date  . AUGMENTATION MAMMAPLASTY Bilateral     There were no vitals filed for this visit.      Subjective Assessment - 11/13/16 0814    Subjective My neck is a little tight this AM.    Currently in Pain? No/denies  Just tightness in her neck   Multiple Pain Sites No                         [facility] Adult PT Treatment/Exercise - 11/13/16 0001      Neck Exercises: Machines for Strengthening   Cybex Row Standing 2 plates 2x 15  VC for posture and TA contraction   Other Machines for Strengthening Standing single arm rows 15# 10x bil     Moist Heat Therapy   Number Minutes Moist Heat 15 Minutes   Moist Heat Location Cervical     Electrical Stimulation   Electrical Stimulation Location Cervical   Electrical Stimulation Action IFC   Electrical Stimulation Parameters to tolerance   Electrical Stimulation Goals Pain;Neuromuscular facilitation                PT Education - 11/13/16 0817    Education provided Yes   Education Details MELT Method LE release sequence for home   Person(s) Educated Patient   Methods  Explanation;Demonstration;Verbal cues;Handout   Comprehension Verbalized understanding;Returned demonstration          PT Short Term Goals - 10/28/16 1519      PT SHORT TERM GOAL #1   Title independent with initial HEP   Time 4   Period Weeks   Status Achieved     PT SHORT TERM GOAL #2   Title understand correct body mechanics with daily tasks and cleaning to reduce strain on neck and back   Time 4   Period Weeks   Status Achieved     PT SHORT TERM GOAL #3   Title pain decreased >/= 25% with daily tasks due to reduction in trigger points in cervical and back muscles   Time 4   Period Weeks   Status Achieved           PT Long Term Goals - 11/11/16 1019      PT LONG TERM GOAL #1   Title independent with HEP   Time 8   Period Weeks   Status On-going     PT LONG TERM GOAL #2   Title perfrom daily activities with stiffness in neck and back decreased >/= 75% due to improved muscle flexibility   Baseline 60-70% improved   Time  8   Period Weeks   Status On-going     PT LONG TERM GOAL #3   Title sitting to watch a 1 hour TV program with cervical and neck and neck pain decreased >/= 75% due to increased postural strength   Time 8   Period Weeks   Status Achieved     PT LONG TERM GOAL #4   Title cleaning her home when bending down with pain decreased >/= 75% due to increased postural  and bilateral hip strength   Baseline 25% less pain   Period Weeks   Status On-going     PT LONG TERM GOAL #5   Title FOTO score </= 31% limitation   Time 8   Period Weeks   Status On-going               Plan - 11/13/16 6644    Clinical Impression Statement Pt presents with some tightness in her Lt upper trap but as she reports it is much better. She is compliant at home with her cervical release techniques for her cervical  adding today the lumbar release sequence to add for home. We started gym exercises for postural strength that she can do at the [facility].    Rehab  Potential Excellent   PT Frequency 2x / week   PT Duration 8 weeks   PT Treatment/Interventions Cryotherapy;Electrical Stimulation;Moist Heat;Ultrasound;Traction;Patient/family education;Neuromuscular re-education;Therapeutic exercise;Therapeutic activities;Manual techniques;Dry needling;Taping   PT Next Visit Plan work on posture during functional tasks such as sweeping, DN if seen by PT and/or complimentary treatment for increased strength for scap stability, posture, core and hip strength, assess lumbar soft tissue and treat as needed   Consulted and Agree with Plan of Care Patient      Patient will benefit from skilled therapeutic intervention in order to improve the following deficits and impairments:  Decreased range of motion, Increased fascial restricitons, Pain, Decreased strength, Decreased mobility, Decreased activity tolerance, Decreased endurance  Visit Diagnosis: Muscle weakness (generalized)  Cervicalgia  Chronic midline low back pain without sciatica     Problem List There are no active problems to display for this patient.   COCHRAN,JENNIFER, PTA 11/13/2016, 9:00 AM  [facility] [address], [address] [place], [place], [address] Phone: [phone]   Fax:  [phone]  Name: Elizabeth Norris MRN: [id number] Date of Birth: 1960/03/02

## 2016-11-13 NOTE — Patient Instructions (Signed)
Lower extremity foam release sequence:  -Roll on the tailbone Relax & breathe   Thighs go side to side SLOWLY and small range of motion  10-20x  Circles: 10x clock wise; 10x counter clockwise  Alternating toe taps 10x  One leg straightens at a time up to the sky: Alternate legs and do 5x  Transition to the bent knee to chest ( hold with your hands) and let the straight leg slower slowly to the floor. We had to use a pillow to rest your foot on on your RT leg.  Hold for 30 sec each side.  End with double knees to the chest and hold for 2 breaths

## 2016-11-18 ENCOUNTER — Encounter: Payer: Self-pay | Admitting: Physical Therapy

## 2016-11-18 ENCOUNTER — Ambulatory Visit: Payer: Managed Care, Other (non HMO) | Attending: Family Medicine | Admitting: Physical Therapy

## 2016-11-18 DIAGNOSIS — M6281 Muscle weakness (generalized): Secondary | ICD-10-CM | POA: Insufficient documentation

## 2016-11-18 DIAGNOSIS — G8929 Other chronic pain: Secondary | ICD-10-CM | POA: Insufficient documentation

## 2016-11-18 DIAGNOSIS — M542 Cervicalgia: Secondary | ICD-10-CM | POA: Diagnosis present

## 2016-11-18 DIAGNOSIS — M545 Low back pain, unspecified: Secondary | ICD-10-CM

## 2016-11-18 NOTE — Therapy (Signed)
[facility] [address] [place], [place], [address] Phone: [phone]   Fax:  [phone]  Physical Therapy Treatment  Patient Details  Name: Elizabeth Norris MRN: [id number] Date of Birth: 10/09/1960 Referring Provider: Dr. Farris Has  Encounter Date: 11/18/2016      PT End of Session - 11/18/16 1020    Visit Number 9   Number of Visits 25   Date for PT Re-Evaluation 11/28/16   Authorization Type [employer]et[employer]   Authorization - Visit Number 9   Authorization - Number of Visits 25   PT Start Time 1015   PT Stop Time 1057   PT Time Calculation (min) 42 min   Activity Tolerance Patient tolerated treatment well   Behavior During Therapy [facility] for tasks assessed/performed      Past Medical History:  Diagnosis Date  . Hypercholesterolemia     Past Surgical History:  Procedure Laterality Date  . AUGMENTATION MAMMAPLASTY Bilateral     There were no vitals filed for this visit.      Subjective Assessment - 11/18/16 1153    Subjective I did a lot of yard work yesterday and I feel tight today.  I feel about the same as last time.     Patient Stated Goals reduce pain   Currently in Pain? No/denies                         [facility] Adult PT Treatment/Exercise - 11/18/16 0001      Neck Exercises: Machines for Strengthening   UBE (Upper Arm Bike) L1 4 min, 4 min forward/reverse  PT present to discuss status     Manual Therapy   Manual Therapy Soft tissue mobilization   Soft tissue mobilization cervical and lumbar multifidi, upper trap, suboccipitals          Trigger Point Dry Needling - 11/18/16 1059    Consent Given? Yes   Muscles Treated Upper Body Upper trapezius;Oblique capitus;Suboccipitals muscle group  thoracic, cervical, lumbar paraspinal   Muscles Treated Lower Body --  lumbar left only   Upper Trapezius Response Twitch reponse elicited;Palpable increased muscle length   Oblique  Capitus Response Twitch response elicited;Palpable increased muscle length   SubOccipitals Response Twitch response elicited;Palpable increased muscle length                PT Short Term Goals - 10/28/16 1519      PT SHORT TERM GOAL #1   Title independent with initial HEP   Time 4   Period Weeks   Status Achieved     PT SHORT TERM GOAL #2   Title understand correct body mechanics with daily tasks and cleaning to reduce strain on neck and back   Time 4   Period Weeks   Status Achieved     PT SHORT TERM GOAL #3   Title pain decreased >/= 25% with daily tasks due to reduction in trigger points in cervical and back muscles   Time 4   Period Weeks   Status Achieved           PT Long Term Goals - 11/18/16 1021      PT LONG TERM GOAL #1   Title independent with HEP   Time 8   Period Weeks     PT LONG TERM GOAL #4   Title cleaning her home when bending down with pain decreased >/= 75% due to increased postural  and bilateral  hip strength   Baseline I mostly feel the pain when I stretch and move around in bed   Time 8   Period Weeks   Status On-going               Plan - 11/18/16 1142    Clinical Impression Statement Pt had muscle spasms on Lt more than Rt side today.  She responded well to manual and dry needling.  Today was 5th DN and will try one more session next week for 6 total and assess whether she has plateaued with that technique.  Pt is able to do more and returned to doing some yoga.  She continues to need therapy to work on core bracing during functional activities.   PT Treatment/Interventions Cryotherapy;Electrical Stimulation;Moist Heat;Ultrasound;Traction;Patient/family education;Neuromuscular re-education;Therapeutic exercise;Therapeutic activities;Manual techniques;Dry needling;Taping   PT Next Visit Plan possibly add traction, work on posture during functional tasks such as sweeping, DN if seen by PT and/or complimentary treatment for  increased strength for scap stability, posture, core and hip strength, assess lumbar soft tissue and treat as needed   PT Home Exercise Plan progress as needed   Consulted and Agree with Plan of Care Patient      Patient will benefit from skilled therapeutic intervention in order to improve the following deficits and impairments:  Decreased range of motion, Increased fascial restricitons, Pain, Decreased strength, Decreased mobility, Decreased activity tolerance, Decreased endurance  Visit Diagnosis: Muscle weakness (generalized)  Cervicalgia  Chronic midline low back pain without sciatica     Problem List There are no active problems to display for this patient.   [name], PT 11/18/2016, 11:55 AM  [facility] [address] [place], [place], [address] Phone: [phone]   Fax:  [phone]  Name: Elizabeth Norris MRN: [id number] Date of Birth: 1960-11-05

## 2016-11-20 ENCOUNTER — Encounter: Payer: Managed Care, Other (non HMO) | Admitting: Physical Therapy

## 2016-11-25 ENCOUNTER — Encounter: Payer: Self-pay | Admitting: Physical Therapy

## 2016-11-25 ENCOUNTER — Ambulatory Visit: Payer: Managed Care, Other (non HMO) | Admitting: Physical Therapy

## 2016-11-25 DIAGNOSIS — M542 Cervicalgia: Secondary | ICD-10-CM

## 2016-11-25 DIAGNOSIS — G8929 Other chronic pain: Secondary | ICD-10-CM

## 2016-11-25 DIAGNOSIS — M6281 Muscle weakness (generalized): Secondary | ICD-10-CM

## 2016-11-25 DIAGNOSIS — M545 Low back pain: Secondary | ICD-10-CM

## 2016-11-25 NOTE — Therapy (Signed)
[facility] [address], [address] [place], [place], [address] Phone: [phone]   Fax:  [phone]  Physical Therapy Treatment  Patient Details  Name: Elizabeth Norris MRN: [id number] Date of Birth: [birth year] Referring Provider: Dr. Farris Has  Encounter Date: 11/25/2016      PT End of Session - 11/25/16 1402    Visit Number 10   Number of Visits 25   Date for PT Re-Evaluation 11/28/16   Authorization Type [employer]etna   Authorization - Visit Number 10   Authorization - Number of Visits 25   PT Start Time 1401   PT Stop Time 1442   PT Time Calculation (min) 41 min   Activity Tolerance Patient tolerated treatment well   Behavior During Therapy [facility] for tasks assessed/performed      Past Medical History:  Diagnosis Date  . Hypercholesterolemia     Past Surgical History:  Procedure Laterality Date  . AUGMENTATION MAMMAPLASTY Bilateral     There were no vitals filed for this visit.      Subjective Assessment - 11/25/16 1405    Subjective I feel okay.  I am still tight in my neck and shoulders   Patient Stated Goals reduce pain   Currently in Pain? No/denies                         [facility] Adult PT Treatment/Exercise - 11/25/16 0001      Neck Exercises: Machines for Strengthening   UBE (Upper Arm Bike) L2 3 min, 3 min forward/reverse  PT present to discuss status     Neck Exercises: Supine   Other Supine Exercise neck AROM on foam roll - rotation and ext for fascial release   Other Supine Exercise supine on foam roller serratus punches, abduction, bilat ER yellow band, daigonals yellow band - 20x each     Knee/Hip Exercises: Supine   Other Supine Knee/Hip Exercises LE marching on foam roll, bent knee abduction with core contraction     Manual Therapy   Manual Therapy Soft tissue mobilization   Soft tissue mobilization self massage with tennis ball - educated and performed                 PT Education - 11/25/16 1444    Education provided Yes   Education Details tennis ball massage   Person(s) Educated Patient   Methods Explanation;Demonstration;Handout;Verbal cues   Comprehension Verbalized understanding;Returned demonstration          PT Short Term Goals - 10/28/16 1519      PT SHORT TERM GOAL #1   Title independent with initial HEP   Time 4   Period Weeks   Status Achieved     PT SHORT TERM GOAL #2   Title understand correct body mechanics with daily tasks and cleaning to reduce strain on neck and back   Time 4   Period Weeks   Status Achieved     PT SHORT TERM GOAL #3   Title pain decreased >/= 25% with daily tasks due to reduction in trigger points in cervical and back muscles   Time 4   Period Weeks   Status Achieved           PT Long Term Goals - 11/25/16 1423      PT LONG TERM GOAL #1   Title independent with HEP   Time 8   Period Weeks   Status On-going  PT LONG TERM GOAL #2   Title perfrom daily activities with stiffness in neck and back decreased >/= 75% due to improved muscle flexibility   Baseline 60-70% improved   Time 8   Period Weeks   Status On-going     PT LONG TERM GOAL #3   Title sitting to watch a 1 hour TV program with cervical and neck and neck pain decreased >/= 75% due to increased postural strength   Time 8   Period Weeks   Status Achieved     PT LONG TERM GOAL #4   Title cleaning her home when bending down with pain decreased >/= 75% due to increased postural  and bilateral hip strength   Time 8   Period Weeks   Status Achieved     PT LONG TERM GOAL #5   Title FOTO score </= 31% limitation   Time 8   Period Weeks   Status On-going               Plan - 11/25/16 1406    Clinical Impression Statement Patient reports she has improved about 20-25% overall.  She reports has been maintaining her progress and able to do more at home.  Pt demonstrates ability to perform  exercises well  and demonstrates good core control with exercises on foam roller.  Skilled PT most likely for one more visit to ensure she is doing well with HEP.   Rehab Potential Excellent   PT Treatment/Interventions Cryotherapy;Electrical Stimulation;Moist Heat;Ultrasound;Traction;Patient/family education;Neuromuscular re-education;Therapeutic exercise;Therapeutic activities;Manual techniques;Dry needling;Taping   PT Next Visit Plan FOTO and goals next, review HEP for discharge   Consulted and Agree with Plan of Care Patient      Patient will benefit from skilled therapeutic intervention in order to improve the following deficits and impairments:  Decreased range of motion, Increased fascial restricitons, Pain, Decreased strength, Decreased mobility, Decreased activity tolerance, Decreased endurance  Visit Diagnosis: Muscle weakness (generalized)  Cervicalgia  Chronic midline low back pain without sciatica     Problem List There are no active problems to display for this patient.   [name], PT 11/25/2016, 2:49 PM  [facility] [address], [address] [place], [place], [address] Phone: [phone]   Fax:  [phone]  Name: Elizabeth Norris MRN: [id number] Date of Birth: [birth year]

## 2016-11-25 NOTE — Patient Instructions (Addendum)
   Thoracic Massage  Place tennis/racquet/lacrosse ball against wall and lean onto the ball. Focus on the tender regions and apply more pressure as tenderness subsides. Rotate head side to side

## 2016-11-27 ENCOUNTER — Ambulatory Visit: Payer: Managed Care, Other (non HMO) | Admitting: Physical Therapy

## 2016-11-27 ENCOUNTER — Encounter: Payer: Self-pay | Admitting: Physical Therapy

## 2016-11-27 DIAGNOSIS — M542 Cervicalgia: Secondary | ICD-10-CM

## 2016-11-27 DIAGNOSIS — M6281 Muscle weakness (generalized): Secondary | ICD-10-CM | POA: Diagnosis not present

## 2016-11-27 DIAGNOSIS — M545 Low back pain: Secondary | ICD-10-CM

## 2016-11-27 DIAGNOSIS — G8929 Other chronic pain: Secondary | ICD-10-CM

## 2016-11-27 NOTE — Therapy (Addendum)
[facility] [address], [place], [place], [address] Phone: [phone]   Fax:  [phone]  Physical Therapy Treatment  Patient Details  Name: Elizabeth Norris MRN: [id number] Date of Birth: [birth year] Referring Provider: Dr. [place] Pepper  Encounter Date: 11/27/2016      PT End of Session - 11/27/16 1023    Visit Number 11   Number of Visits 25   Date for PT Re-Evaluation 11/28/16   Authorization Type Aetna   Authorization - Visit Number 11   Authorization - Number of Visits 25   PT Start Time 1020  a little late   PT Stop Time 1100   PT Time Calculation (min) 40 min   Activity Tolerance Patient tolerated treatment well   Behavior During Therapy [facility] for tasks assessed/performed      Past Medical History:  Diagnosis Date  . Hypercholesterolemia     Past Surgical History:  Procedure Laterality Date  . AUGMENTATION MAMMAPLASTY Bilateral     There were no vitals filed for this visit.          [facility] PT Assessment - 11/27/16 0001      Observation/Other Assessments   Focus on Therapeutic Outcomes (FOTO)  42% limitation     AROM   Cervical - Right Rotation WNL   Cervical - Left Rotation less than 25% limited   Lumbar Flexion full   Lumbar Extension decreased by 25%   Lumbar - Right Side Bend WNl   Lumbar - Left Side Bend full   Lumbar - Right Rotation WNl   Lumbar - Left Rotation full     Strength   Right Hip Flexion 5/5   Right Hip ABduction 5/5   Left Hip Flexion 5/5   Left Hip ABduction 5/5  VC for core activation                     [facility] Adult PT Treatment/Exercise - 11/27/16 0001      Neck Exercises: Machines for Strengthening   UBE (Upper Arm Bike) L2 3 min, 3 min forward/reverse  pillow for lumbar support     Neck Exercises: Supine   Other Supine Exercise Supine cervical and lumbar decompression on foam including UE/LE movments  red band horizonal pulls 2x15,  thwn 10x diagonals bil                  PT Short Term Goals - 10/28/16 1519      PT SHORT TERM GOAL #1   Title independent with initial HEP   Time 4   Period Weeks   Status Achieved     PT SHORT TERM GOAL #2   Title understand correct body mechanics with daily tasks and cleaning to reduce strain on neck and back   Time 4   Period Weeks   Status Achieved     PT SHORT TERM GOAL #3   Title pain decreased >/= 25% with daily tasks due to reduction in trigger points in cervical and back muscles   Time 4   Period Weeks   Status Achieved           PT Long Term Goals - 11/27/16 1031      PT LONG TERM GOAL #1   Title independent with HEP   Time 8   Period Weeks   Status Achieved     PT LONG TERM GOAL #2   Title perfrom daily activities with stiffness in neck and  back decreased >/= 75% due to improved muscle flexibility   Time 8   Period Weeks   Status Not Met  25%     PT LONG TERM GOAL #5   Title FOTO score </= 31% limitation   Time 8   Period Weeks   Status Not Met  42%               Plan - 11/27/16 1024    Clinical Impression Statement Pt pleased with her progress, still reporting 25% improvement today. Her FOTO score was essentially unchanged but her cervical and lumbar AROM was improved as was her hip strength bil. She is prepared to continue with her HEP and is contemplating joining a yoga class.    Rehab Potential Excellent   Clinical Impairments Affecting Rehab Potential None   PT Frequency 2x / week   PT Duration 8 weeks   PT Treatment/Interventions Cryotherapy;Electrical Stimulation;Moist Heat;Ultrasound;Traction;Patient/family education;Neuromuscular re-education;Therapeutic exercise;Therapeutic activities;Manual techniques;Dry needling;Taping   PT Next Visit Plan DC to HEP per PT instructions.    Consulted and Agree with Plan of Care --      Patient will benefit from skilled therapeutic intervention in order to improve the following  deficits and impairments:  Decreased range of motion, Increased fascial restricitons, Pain, Decreased strength, Decreased mobility, Decreased activity tolerance, Decreased endurance  Visit Diagnosis: Muscle weakness (generalized)  Cervicalgia  Chronic midline low back pain without sciatica     Problem List There are no active problems to display for this patient.   Chloe Baig 11/27/2016, 11:03 AM  [facility] [address] [place], [place], [address] Phone: [phone]   Fax:  [phone]  Name: Elizabeth Norris MRN: [id number] Date of Birth: March 20, 1960  PHYSICAL THERAPY DISCHARGE SUMMARY  Visits from Start of Care: 11  Current functional level related to goals / functional outcomes: See above   Remaining deficits: See above details   Education / Equipment: HEP Plan: Patient agrees to discharge.  Patient goals were partially met. Patient is being discharged due to being pleased with the current functional level.  ?????         Google, PT 11/27/16 2:33 PM

## 2017-09-23 ENCOUNTER — Other Ambulatory Visit: Payer: Self-pay | Admitting: Family Medicine

## 2017-09-23 ENCOUNTER — Other Ambulatory Visit (HOSPITAL_COMMUNITY)
Admission: RE | Admit: 2017-09-23 | Discharge: 2017-09-23 | Disposition: A | Discharge status: Home / Self Care | Payer: 59 | Source: Ambulatory Visit | Attending: Family Medicine | Admitting: Family Medicine

## 2017-09-23 DIAGNOSIS — Z1231 Encounter for screening mammogram for malignant neoplasm of breast: Secondary | ICD-10-CM

## 2017-09-23 DIAGNOSIS — Z01411 Encounter for gynecological examination (general) (routine) with abnormal findings: Secondary | ICD-10-CM | POA: Diagnosis not present

## 2017-09-24 LAB — CYTOLOGY - PAP
Diagnosis: NEGATIVE
HPV: NOT DETECTED

## 2017-10-28 ENCOUNTER — Ambulatory Visit
Admission: RE | Admit: 2017-10-28 | Discharge: 2017-10-28 | Disposition: A | Discharge status: Home / Self Care | Payer: Managed Care, Other (non HMO) | Source: Ambulatory Visit | Attending: Family Medicine | Admitting: Family Medicine

## 2017-10-28 DIAGNOSIS — Z1231 Encounter for screening mammogram for malignant neoplasm of breast: Secondary | ICD-10-CM

## 2018-09-30 ENCOUNTER — Other Ambulatory Visit: Payer: Self-pay | Admitting: Family Medicine

## 2018-09-30 DIAGNOSIS — Z1231 Encounter for screening mammogram for malignant neoplasm of breast: Secondary | ICD-10-CM

## 2018-11-03 ENCOUNTER — Ambulatory Visit
Admission: RE | Admit: 2018-11-03 | Discharge: 2018-11-03 | Disposition: A | Discharge status: Home / Self Care | Payer: 59 | Source: Ambulatory Visit | Attending: Family Medicine | Admitting: Family Medicine

## 2018-11-03 ENCOUNTER — Other Ambulatory Visit: Payer: Self-pay | Admitting: Family Medicine

## 2018-11-03 DIAGNOSIS — R52 Pain, unspecified: Secondary | ICD-10-CM

## 2018-11-16 ENCOUNTER — Ambulatory Visit
Admission: RE | Admit: 2018-11-16 | Discharge: 2018-11-16 | Disposition: A | Discharge status: Home / Self Care | Payer: 59 | Source: Ambulatory Visit | Attending: Family Medicine | Admitting: Family Medicine

## 2018-11-16 ENCOUNTER — Other Ambulatory Visit: Payer: Self-pay

## 2018-11-16 DIAGNOSIS — Z1231 Encounter for screening mammogram for malignant neoplasm of breast: Secondary | ICD-10-CM

## 2018-11-17 ENCOUNTER — Other Ambulatory Visit: Payer: Self-pay | Admitting: Family Medicine

## 2018-11-17 DIAGNOSIS — R928 Other abnormal and inconclusive findings on diagnostic imaging of breast: Secondary | ICD-10-CM

## 2018-11-20 ENCOUNTER — Ambulatory Visit: Admission: RE | Admit: 2018-11-20 | Payer: 59 | Source: Ambulatory Visit

## 2018-11-20 ENCOUNTER — Other Ambulatory Visit: Payer: Self-pay | Admitting: Family Medicine

## 2018-11-20 ENCOUNTER — Other Ambulatory Visit: Payer: Self-pay

## 2018-11-20 ENCOUNTER — Ambulatory Visit
Admission: RE | Admit: 2018-11-20 | Discharge: 2018-11-20 | Disposition: A | Discharge status: Home / Self Care | Payer: 59 | Source: Ambulatory Visit | Attending: Family Medicine | Admitting: Family Medicine

## 2018-11-20 DIAGNOSIS — R928 Other abnormal and inconclusive findings on diagnostic imaging of breast: Secondary | ICD-10-CM

## 2019-03-18 ENCOUNTER — Encounter: Payer: 59 | Attending: Family Medicine | Admitting: Dietician

## 2019-03-18 ENCOUNTER — Encounter: Payer: Self-pay | Admitting: Dietician

## 2019-03-18 ENCOUNTER — Other Ambulatory Visit: Payer: Self-pay

## 2019-03-18 DIAGNOSIS — E119 Type 2 diabetes mellitus without complications: Secondary | ICD-10-CM | POA: Insufficient documentation

## 2019-03-18 NOTE — Progress Notes (Signed)
Diabetes Self-Management Education  Visit Type: First/Initial  Appt. Start Time: 10:30 Appt. End Time: 11:30  03/18/2019  [name], identified by name and date of birth, is a 59 y.o. female with a diagnosis of Diabetes: Type 2.   ASSESSMENT  Pt is confused as to why her A1c was at 6.6 when her BG levels were in range while testing (~4 times a week, some fasting&some postprandial) Pt reports testing her BG and being within range most times Pt reports formerly testing daily, but now 4 times a week. Pt tests fasting, and postprandial and logs values. Pt reports going through body changes at her age (menopause).  Reports losing a little motivation during pandemic and cold weather. Pt reports only having to feed herself and her husband, and that he would be agreeable to any dietary changes she would make. Pt has a Photographer.  Height 4' 11.5" (1.511 m), weight 127 lb 3.2 oz (57.7 kg). Body mass index is 25.26 kg/m.  Diabetes Self-Management Education - 03/18/19 1043      Visit Information   Visit Type  First/Initial      Initial Visit   Diabetes Type  Type 2    Are you currently following a meal plan?  No    Are you taking your medications as prescribed?  No    Date Diagnosed  Fall 2020      Health Coping   How would you rate your overall health?  Fair      Psychosocial Assessment   Patient Belief/Attitude about Diabetes  Motivated to manage diabetes    Self-care barriers  None    Self-management support  Family;Friends    Patient Concerns  Nutrition/Meal planning    Special Needs  None    Preferred Learning Style  No preference indicated    Learning Readiness  Ready    How often do you need to have someone help you when you read instructions, pamphlets, or other written materials from your doctor or pharmacy?  2 - Rarely    What is the last grade level you completed in school?  [employer]      Pre-Education Assessment   Patient understands the  diabetes disease and treatment process.  Needs Instruction    Patient understands incorporating nutritional management into lifestyle.  Needs Instruction    Patient undertands incorporating physical activity into lifestyle.  Needs Instruction    Patient understands using medications safely.  Needs Instruction    Patient understands monitoring blood glucose, interpreting and using results  Needs Instruction    Patient understands prevention, detection, and treatment of acute complications.  Needs Instruction    Patient understands prevention, detection, and treatment of chronic complications.  Needs Instruction    Patient understands how to develop strategies to address psychosocial issues.  Needs Instruction    Patient understands how to develop strategies to promote health/change behavior.  Needs Instruction      Complications   Last HgB A1C per patient/outside source  6.6 %    How often do you check your blood sugar?  3-4 times / week    Fasting Blood glucose range (mg/dL)  76-734    Postprandial Blood glucose range (mg/dL)  193-790    Number of hypoglycemic episodes per month  0    Number of hyperglycemic episodes per week  0    Have you had a dilated eye exam in the past 12 months?  Yes    Have you had a  dental exam in the past 12 months?  Yes    Are you checking your feet?  No      Dietary Intake   Breakfast  omelette with veggies (spinach, onions, peppers) slice of toast, glass of Oj about 12    Snack (morning)  none    Lunch  [employer]. Asian chicken salad, 1/2 steak and cheese sandwich, water and half blue berry muffin    Snack (afternoon)  none    Dinner  Rice, stirfry, broccoli, beef Bulgogi. water    Snack (evening)  1/2 blueberry muffin    Beverage(s)  water (~40 oz), orange juice      Exercise   Exercise Type  ADL's;Light (walking / raking leaves)    How many days per week to you exercise?  1    How many minutes per day do you exercise?  30    Total minutes per week of  exercise  30      Patient Education   Previous Diabetes Education  No    Disease state   Definition of diabetes, type 1 and 2, and the diagnosis of diabetes;Factors that contribute to the development of diabetes    Nutrition management   Carbohydrate counting;Role of diet in the treatment of diabetes and the relationship between the three main macronutrients and blood glucose level    Monitoring  Taught/evaluated SMBG meter.;Purpose and frequency of SMBG.    Acute complications  Taught treatment of hypoglycemia - the 15 rule.;Discussed and identified patients' treatment of hyperglycemia.    Chronic complications  Assessed and discussed foot care and prevention of foot problems    Psychosocial adjustment  Worked with patient to identify barriers to care and solutions;Helped patient identify a support system for diabetes management;Identified and addressed patients feelings and concerns about diabetes    Personal strategies to promote health  Helped patient develop diabetes management plan for (enter comment)   consistent carbohydrate intake     Individualized Goals (developed by patient)   Nutrition  Follow meal plan discussed    Physical Activity  Exercise 3-5 times per week    Medications  Not Applicable    Monitoring   test blood glucose pre and post meals as discussed;test my blood glucose as discussed    Reducing Risk  examine blood glucose patterns      Post-Education Assessment   Patient understands the diabetes disease and treatment process.  Demonstrates understanding / competency    Patient understands incorporating nutritional management into lifestyle.  Needs Review    Patient undertands incorporating physical activity into lifestyle.  Needs Review    Patient understands monitoring blood glucose, interpreting and using results  Demonstrates understanding / competency    Patient understands prevention, detection, and treatment of acute complications.  Demonstrates understanding /  competency    Patient understands prevention, detection, and treatment of chronic complications.  Needs Review    Patient understands how to develop strategies to address psychosocial issues.  Demonstrates understanding / competency    Patient understands how to develop strategies to promote health/change behavior.  Demonstrates understanding / competency      Outcomes   Expected Outcomes  Demonstrated interest in learning. Expect positive outcomes    Future DMSE  4-6 wks    Program Status  Not Completed       Individualized Plan for Diabetes Self-Management Training:   Learning Objective:  Patient will have a greater understanding of diabetes self-management. Patient education plan is to attend individual and/or  group sessions per assessed needs and concerns.   Plan:   Check BG twice a day, once fasting and once 1.5 hours after a meal. Work on recognizing carbohydrate sources, and proper portions that equal your allowed carb choices per meal/snack. Work towards trying to drink 48-64 oz of water a day. Keep on eating those veggies!  Expected Outcomes:  Demonstrated interest in learning. Expect positive outcomes  Education material provided: ADA - How to Thrive: A Guide for Your Journey with Diabetes, Meal plan card and My Plate  If problems or questions, patient to contact team via:  Phone and Email  Future DSME appointment: 4-6 wks

## 2019-04-15 ENCOUNTER — Ambulatory Visit: Payer: 59 | Admitting: Dietician

## 2019-04-20 ENCOUNTER — Encounter: Payer: Self-pay | Admitting: Dietician

## 2019-04-20 ENCOUNTER — Other Ambulatory Visit: Payer: Self-pay

## 2019-04-20 ENCOUNTER — Encounter: Payer: 59 | Attending: Family Medicine | Admitting: Dietician

## 2019-04-20 DIAGNOSIS — E119 Type 2 diabetes mellitus without complications: Secondary | ICD-10-CM | POA: Insufficient documentation

## 2019-04-20 NOTE — Patient Instructions (Signed)
Aim to eat at least 3 times per day.   Focus on including fiber and protein with all meals and snacks, especially at breakfast.   Avoid added sugars as much as possible (including dessert, sweets, juice, soda, sweet tea, etc.)

## 2019-04-20 NOTE — Progress Notes (Signed)
Diabetes Self-Management Education  Visit Type: Follow-up  04/20/2019  [name], identified by name and date of birth, is a 59 y.o. female with a diagnosis of Diabetes: Type 2.   ASSESSMENT  Weight 126 lb 8 oz (57.4 kg). Body mass index is 25.12 kg/m.   Patient states she is concerned with her weight and would like to lose some weight. States she struggles with cravings, especially in the evenings. States her lifestyle is quite busy and stressful, as she and her husband are currently in the process of selling their house and moving to [facility]. States that, due to this, she is not able to consistently check her blood sugar daily but will a few times per week. Typical meal pattern is 2-3 meals per day, does not usually snack. States she has been drinking soda recently, as she has had cravings for this.   Diabetes Self-Management Education - 04/20/19 2409      Visit Information   Visit Type  Follow-up      Psychosocial Assessment   Self-care barriers  None    Patient Concerns  Weight Control    Special Needs  None    Learning Readiness  Ready      Complications   How often do you check your blood sugar?  1-2 times/day    Fasting Blood glucose range (mg/dL)  73-532    Postprandial Blood glucose range (mg/dL)  99-242      Dietary Intake   Breakfast  3 rolls    Lunch  chicken + rice + vegetables    Dinner  skipped    Beverage(s)  water, soda      Exercise   Exercise Type  ADL's    How many days per week to you exercise?  0    How many minutes per day do you exercise?  0    Total minutes per week of exercise  0      Patient Education   Previous Diabetes Education  Yes (please comment)   at [facility] in January 2021   Nutrition management   Role of diet in the treatment of diabetes and the relationship between the three main macronutrients and blood glucose level;Meal options for control of blood glucose level and chronic complications.    Psychosocial adjustment  Worked with  patient to identify barriers to care and solutions;Role of stress on diabetes      Individualized Goals (developed by patient)   Nutrition  Follow meal plan discussed   aim to eat at least 3 times per day; include protein with breakfast and snacks; importance of fiber and protein   Monitoring   test my blood glucose as discussed   per MD's recommendations     Outcomes   Expected Outcomes  Demonstrated interest in learning. Expect positive outcomes    Future DMSE  4-6 wks      Subsequent Visit   Since your last visit have you experienced any weight changes?  No change    Since your last visit, are you checking your blood glucose at least once a day?  Yes       Individualized Plan for Diabetes Self-Management Training:  Learning Objective:  Patient will have a greater understanding of diabetes self-management. Patient education plan is to attend individual and/or group sessions per assessed needs and concerns.  We discussed the importance of eating breakfast and having consistent meals/snacks throughout the day to curb hunger and cravings, especially later in the day. We  discussed protein and fiber and how these nutrients are important for managing blood sugar, weight, and supporting overall health. Additionally, we reviewed what A1c means and how it is directly related to blood sugar levels.    Plan:  Patient Instructions  Aim to eat at least 3 times per day.   Focus on including fiber and protein with all meals and snacks, especially at breakfast.   Avoid added sugars as much as possible (including dessert, sweets, juice, soda, sweet tea, etc.)    Expected Outcomes:  Demonstrated interest in learning. Expect positive outcomes  Education material provided: Breakfast Ideas, Balanced Snacks   If problems or questions, patient to contact team via:  Phone and Email  Future DSME appointment: 4-6 wks

## 2019-05-31 ENCOUNTER — Ambulatory Visit: Payer: 59 | Admitting: Dietician

## 2020-02-08 ENCOUNTER — Other Ambulatory Visit: Payer: Self-pay | Admitting: Family Medicine

## 2020-02-08 DIAGNOSIS — Z0000 Encounter for general adult medical examination without abnormal findings: Secondary | ICD-10-CM

## 2020-03-20 ENCOUNTER — Ambulatory Visit: Payer: 59

## 2020-03-21 ENCOUNTER — Ambulatory Visit
Admission: RE | Admit: 2020-03-21 | Discharge: 2020-03-21 | Disposition: A | Discharge status: Home / Self Care | Payer: 59 | Source: Ambulatory Visit | Attending: Family Medicine | Admitting: Family Medicine

## 2020-03-21 ENCOUNTER — Ambulatory Visit
Admission: RE | Admit: 2020-03-21 | Discharge: 2020-03-21 | Disposition: A | Discharge status: Home / Self Care | Payer: No Typology Code available for payment source | Source: Ambulatory Visit | Attending: Family Medicine | Admitting: Family Medicine

## 2020-03-21 ENCOUNTER — Other Ambulatory Visit: Payer: Self-pay

## 2020-03-21 ENCOUNTER — Other Ambulatory Visit: Payer: Self-pay | Admitting: Family Medicine

## 2020-03-21 DIAGNOSIS — E2839 Other primary ovarian failure: Secondary | ICD-10-CM

## 2020-03-21 DIAGNOSIS — Z0000 Encounter for general adult medical examination without abnormal findings: Secondary | ICD-10-CM

## 2020-03-22 ENCOUNTER — Other Ambulatory Visit: Payer: Self-pay | Admitting: Family Medicine

## 2020-03-22 DIAGNOSIS — R928 Other abnormal and inconclusive findings on diagnostic imaging of breast: Secondary | ICD-10-CM

## 2020-04-06 ENCOUNTER — Other Ambulatory Visit: Payer: Self-pay

## 2020-04-06 ENCOUNTER — Ambulatory Visit
Admission: RE | Admit: 2020-04-06 | Discharge: 2020-04-06 | Disposition: A | Discharge status: Home / Self Care | Payer: No Typology Code available for payment source | Source: Ambulatory Visit | Attending: Family Medicine | Admitting: Family Medicine

## 2020-04-06 DIAGNOSIS — R928 Other abnormal and inconclusive findings on diagnostic imaging of breast: Secondary | ICD-10-CM

## 2020-11-21 IMAGING — [facility]  DIGITAL SCREENING BREAST BILAT IMPLANT W/ TOMO W/ CAD
9 of 12 series · 9 of 28 positions shown · non-contrast
Comparison: Previous exam(s).

CLINICAL DATA: Screening.

EXAM:
DIGITAL SCREENING BILATERAL MAMMOGRAM WITH IMPLANTS, CAD AND TOMO
The patient has retropectoral implants. Standard and implant
displaced views were performed.

[L CC]
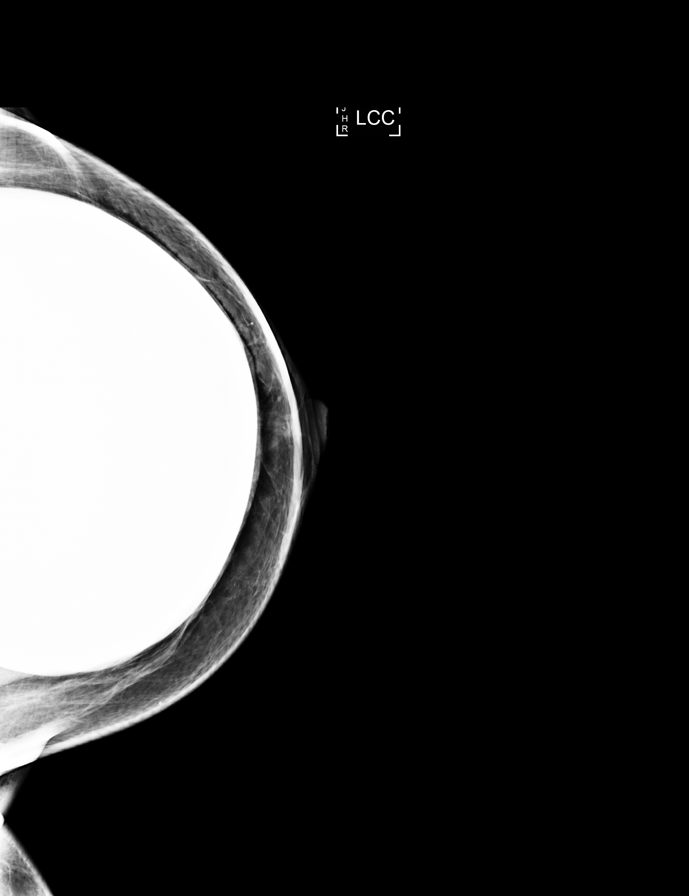

[R CC]
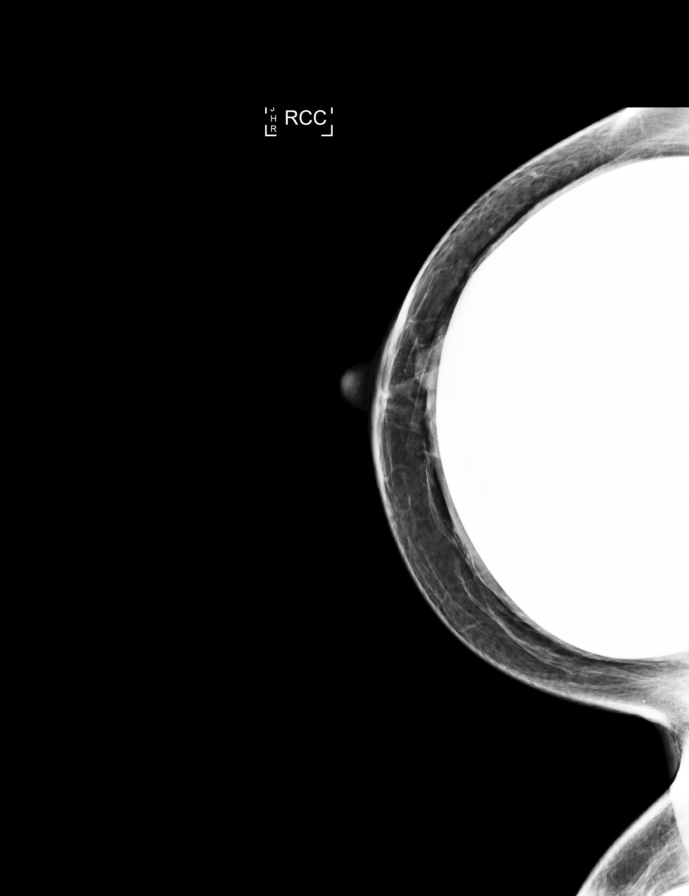

[L MLO]
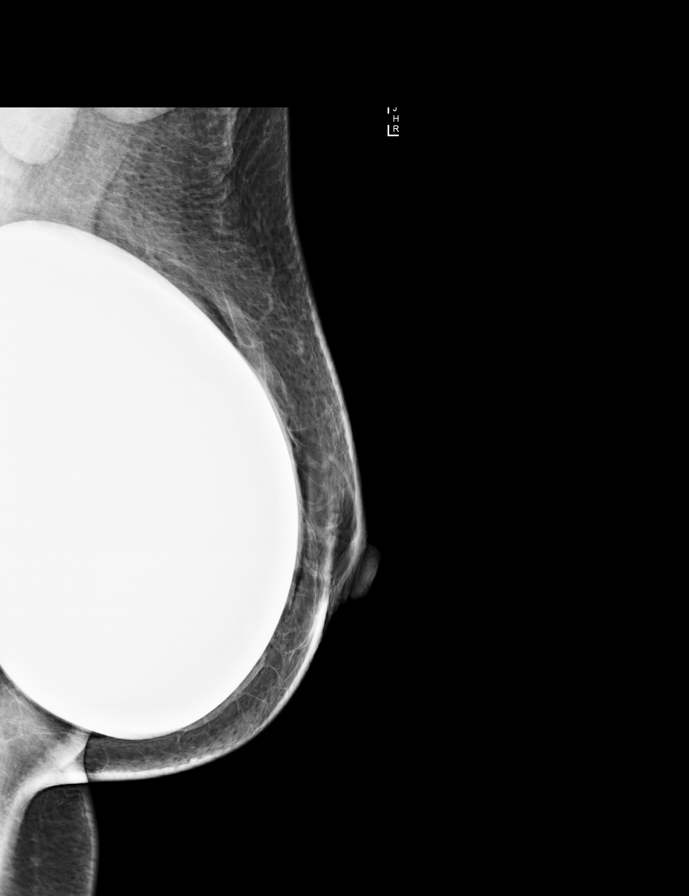

[R MLO]
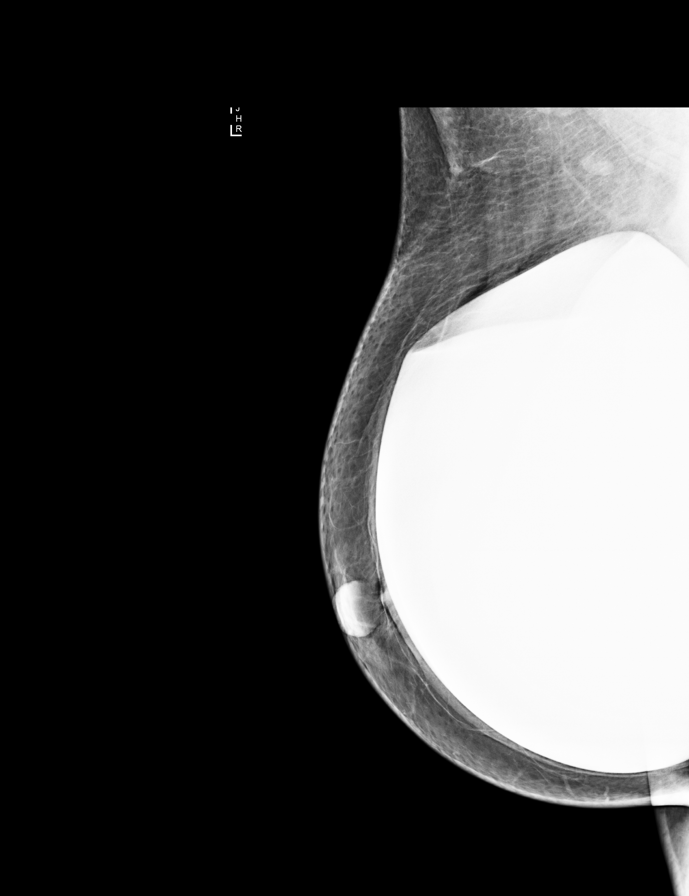

[L CC synth-2D]
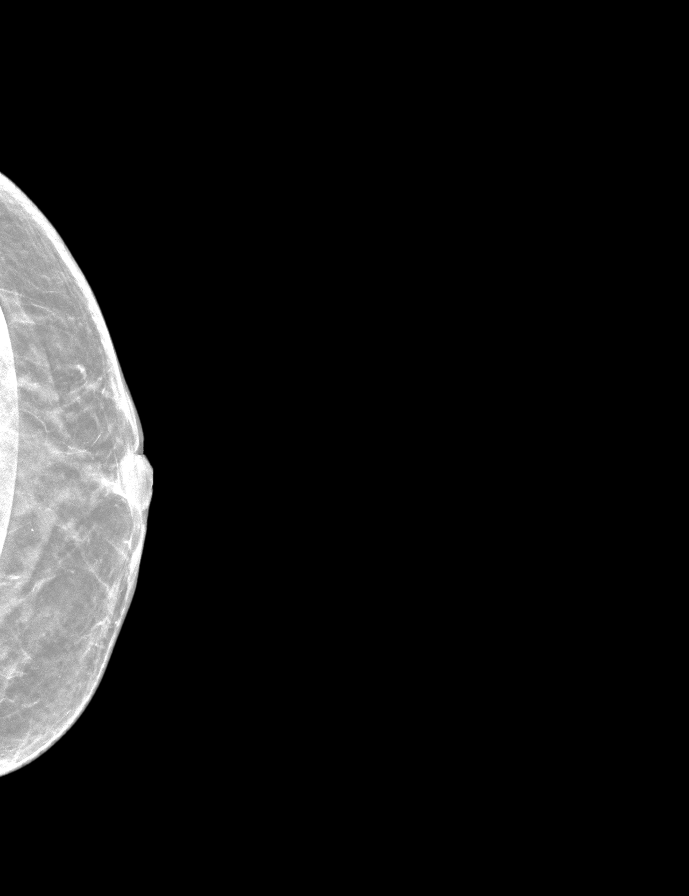

[L MLO synth-2D]
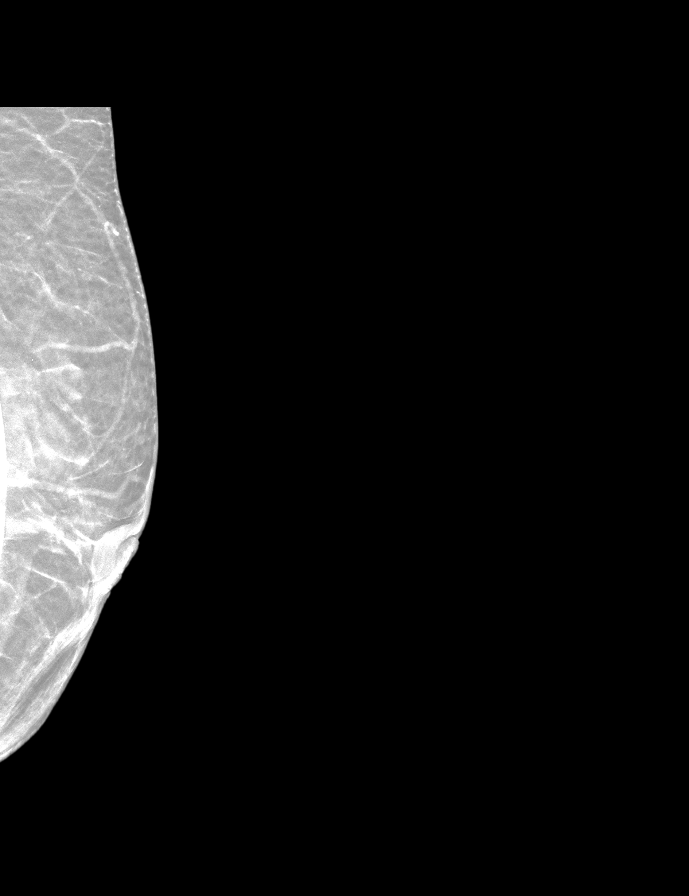

[R MLO synth-2D]
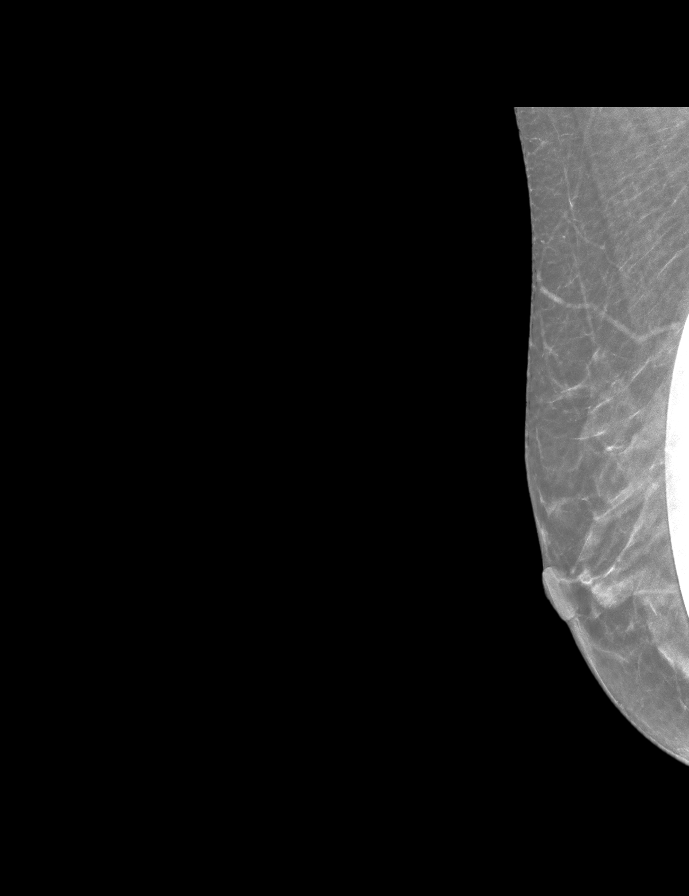

[R CC synth-2D]
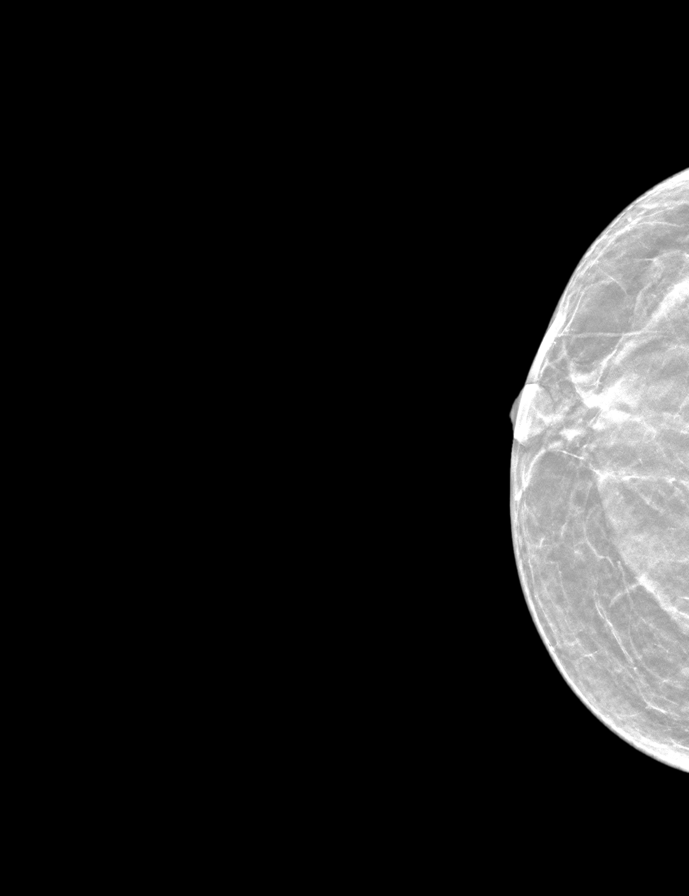

[L MLOID BREAST TOMOSYNTHESIS IMAGE tomo · tomo slice 19/36.0]
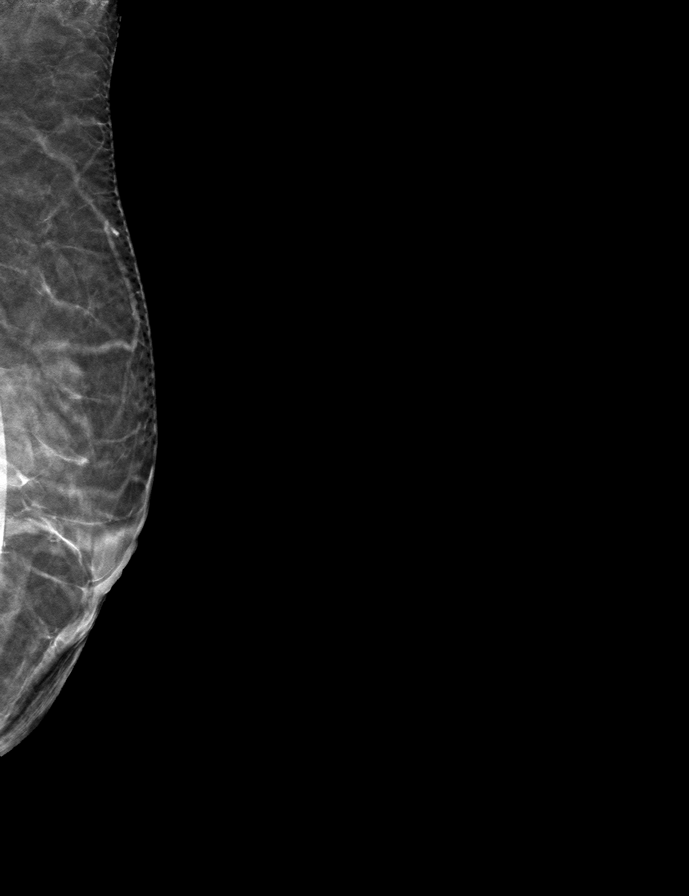

[9 of 28 positions shown; findings below may reference images not displayed]

ACR Breast Density Category b: There are scattered areas of
fibroglandular density.
FINDINGS: In the right breast, a possible asymmetry warrants further
evaluation. In the left breast, no suspicious masses or malignant
type calcifications are identified. Enlarged LEFT axillary lymph
nodes again identified. Images were processed with CAD.
IMPRESSION: Further evaluation is suggested for possible asymmetry in the right
breast.

RECOMMENDATION:
3D diagnostic mammogram and possibly ultrasound of the right breast.
(Code:[id number])

The patient will be contacted regarding the findings, and additional
imaging will be scheduled.

BI-RADS CATEGORY  0: Incomplete. Need additional imaging evaluation
and/or prior mammograms for comparison.

## 2021-03-02 ENCOUNTER — Other Ambulatory Visit: Payer: Self-pay | Admitting: Family Medicine

## 2021-03-02 DIAGNOSIS — Z1231 Encounter for screening mammogram for malignant neoplasm of breast: Secondary | ICD-10-CM

## 2021-03-23 ENCOUNTER — Ambulatory Visit
Admission: RE | Admit: 2021-03-23 | Discharge: 2021-03-23 | Disposition: A | Discharge status: Home / Self Care | Payer: No Typology Code available for payment source | Source: Ambulatory Visit | Attending: Family Medicine | Admitting: Family Medicine

## 2021-03-23 ENCOUNTER — Other Ambulatory Visit: Payer: Self-pay

## 2021-03-23 DIAGNOSIS — Z1231 Encounter for screening mammogram for malignant neoplasm of breast: Secondary | ICD-10-CM

## 2022-04-26 ENCOUNTER — Other Ambulatory Visit: Payer: Self-pay | Admitting: Emergency Medicine

## 2022-04-26 DIAGNOSIS — Z1231 Encounter for screening mammogram for malignant neoplasm of breast: Secondary | ICD-10-CM

## 2022-06-12 ENCOUNTER — Other Ambulatory Visit: Payer: Self-pay | Admitting: Emergency Medicine

## 2022-06-12 ENCOUNTER — Ambulatory Visit
Admission: RE | Admit: 2022-06-12 | Discharge: 2022-06-12 | Disposition: A | Discharge status: Home / Self Care | Payer: No Typology Code available for payment source | Source: Ambulatory Visit | Attending: Emergency Medicine | Admitting: Emergency Medicine

## 2022-06-12 DIAGNOSIS — Z1231 Encounter for screening mammogram for malignant neoplasm of breast: Secondary | ICD-10-CM

## 2022-10-15 ENCOUNTER — Other Ambulatory Visit: Payer: Self-pay | Admitting: Family Medicine

## 2022-10-15 ENCOUNTER — Other Ambulatory Visit (HOSPITAL_COMMUNITY)
Admission: RE | Admit: 2022-10-15 | Discharge: 2022-10-15 | Disposition: A | Discharge status: Home / Self Care | Payer: No Typology Code available for payment source | Source: Ambulatory Visit | Attending: Family Medicine | Admitting: Family Medicine

## 2022-10-15 DIAGNOSIS — Z01419 Encounter for gynecological examination (general) (routine) without abnormal findings: Secondary | ICD-10-CM | POA: Diagnosis present

## 2022-10-18 ENCOUNTER — Other Ambulatory Visit: Payer: Self-pay | Admitting: Family Medicine

## 2022-10-18 DIAGNOSIS — E2839 Other primary ovarian failure: Secondary | ICD-10-CM

## 2022-10-24 LAB — CYTOLOGY - PAP
Comment: NEGATIVE
Diagnosis: NEGATIVE
High risk HPV: NEGATIVE

## 2023-02-21 ENCOUNTER — Other Ambulatory Visit (HOSPITAL_COMMUNITY): Payer: Self-pay | Admitting: Neurosurgery

## 2023-02-21 ENCOUNTER — Other Ambulatory Visit (HOSPITAL_BASED_OUTPATIENT_CLINIC_OR_DEPARTMENT_OTHER): Payer: Self-pay | Admitting: Family Medicine

## 2023-02-21 DIAGNOSIS — E785 Hyperlipidemia, unspecified: Secondary | ICD-10-CM

## 2023-03-24 ENCOUNTER — Ambulatory Visit (HOSPITAL_COMMUNITY)
Admission: RE | Admit: 2023-03-24 | Discharge: 2023-03-24 | Disposition: A | Discharge status: Home / Self Care | Payer: Self-pay | Source: Ambulatory Visit | Attending: Family Medicine | Admitting: Family Medicine

## 2023-03-24 ENCOUNTER — Encounter (HOSPITAL_COMMUNITY): Payer: Self-pay

## 2023-03-24 ENCOUNTER — Other Ambulatory Visit: Payer: No Typology Code available for payment source

## 2023-03-24 ENCOUNTER — Other Ambulatory Visit (HOSPITAL_COMMUNITY): Payer: No Typology Code available for payment source

## 2023-03-24 DIAGNOSIS — E785 Hyperlipidemia, unspecified: Secondary | ICD-10-CM | POA: Insufficient documentation

## 2023-04-28 ENCOUNTER — Ambulatory Visit
Admission: RE | Admit: 2023-04-28 | Discharge: 2023-04-28 | Disposition: A | Discharge status: Home / Self Care | Payer: No Typology Code available for payment source | Source: Ambulatory Visit | Attending: Family Medicine | Admitting: Family Medicine

## 2023-04-28 DIAGNOSIS — E2839 Other primary ovarian failure: Secondary | ICD-10-CM

## 2023-05-12 ENCOUNTER — Other Ambulatory Visit: Payer: Self-pay | Admitting: Family Medicine

## 2023-05-12 DIAGNOSIS — Z1231 Encounter for screening mammogram for malignant neoplasm of breast: Secondary | ICD-10-CM

## 2023-06-24 ENCOUNTER — Ambulatory Visit
Admission: RE | Admit: 2023-06-24 | Discharge: 2023-06-24 | Disposition: A | Discharge status: Home / Self Care | Source: Ambulatory Visit | Attending: Family Medicine | Admitting: Family Medicine

## 2023-06-24 DIAGNOSIS — Z1231 Encounter for screening mammogram for malignant neoplasm of breast: Secondary | ICD-10-CM
